# Patient Record
Sex: Male | Born: 1935 | Race: Black or African American | Hispanic: No | Marital: Married | Smoking: Never smoker
Health system: Southern US, Community
[De-identification: ages and names within clinical notes are randomized; demographics above are authoritative.]

## PROBLEM LIST (undated history)

## (undated) DIAGNOSIS — M1990 Unspecified osteoarthritis, unspecified site: Secondary | ICD-10-CM

## (undated) DIAGNOSIS — G4730 Sleep apnea, unspecified: Secondary | ICD-10-CM

## (undated) DIAGNOSIS — K5790 Diverticulosis of intestine, part unspecified, without perforation or abscess without bleeding: Secondary | ICD-10-CM

## (undated) DIAGNOSIS — J309 Allergic rhinitis, unspecified: Secondary | ICD-10-CM

## (undated) DIAGNOSIS — I10 Essential (primary) hypertension: Secondary | ICD-10-CM

## (undated) DIAGNOSIS — Z8619 Personal history of other infectious and parasitic diseases: Secondary | ICD-10-CM

## (undated) DIAGNOSIS — F0390 Unspecified dementia without behavioral disturbance: Secondary | ICD-10-CM

## (undated) DIAGNOSIS — E119 Type 2 diabetes mellitus without complications: Secondary | ICD-10-CM

## (undated) DIAGNOSIS — H409 Unspecified glaucoma: Secondary | ICD-10-CM

## (undated) HISTORY — DX: Type 2 diabetes mellitus without complications: E11.9

## (undated) HISTORY — DX: Diverticulosis of intestine, part unspecified, without perforation or abscess without bleeding: K57.90

## (undated) HISTORY — DX: Unspecified osteoarthritis, unspecified site: M19.90

## (undated) HISTORY — DX: Sleep apnea, unspecified: G47.30

## (undated) HISTORY — DX: Essential (primary) hypertension: I10

## (undated) HISTORY — DX: Unspecified glaucoma: H40.9

## (undated) HISTORY — DX: Allergic rhinitis, unspecified: J30.9

## (undated) HISTORY — DX: Personal history of other infectious and parasitic diseases: Z86.19

---

## 1997-08-24 ENCOUNTER — Ambulatory Visit (HOSPITAL_COMMUNITY): Admission: RE | Admit: 1997-08-24 | Discharge: 1997-08-24 | Payer: Self-pay | Admitting: *Deleted

## 1999-12-20 ENCOUNTER — Encounter: Payer: Self-pay | Admitting: Internal Medicine

## 1999-12-20 ENCOUNTER — Encounter: Admission: RE | Admit: 1999-12-20 | Discharge: 1999-12-20 | Payer: Self-pay | Admitting: Internal Medicine

## 2000-01-03 ENCOUNTER — Encounter: Payer: Self-pay | Admitting: Internal Medicine

## 2000-01-03 ENCOUNTER — Encounter: Admission: RE | Admit: 2000-01-03 | Discharge: 2000-01-03 | Payer: Self-pay | Admitting: Internal Medicine

## 2000-10-02 ENCOUNTER — Inpatient Hospital Stay (HOSPITAL_COMMUNITY): Admission: AD | Admit: 2000-10-02 | Discharge: 2000-10-06 | Payer: Self-pay | Admitting: Internal Medicine

## 2000-10-09 ENCOUNTER — Encounter: Admission: RE | Admit: 2000-10-09 | Discharge: 2001-01-07 | Payer: Self-pay | Admitting: Internal Medicine

## 2001-01-22 ENCOUNTER — Ambulatory Visit (HOSPITAL_COMMUNITY): Admission: AD | Admit: 2001-01-22 | Discharge: 2001-01-22 | Payer: Self-pay | Admitting: Interventional Cardiology

## 2001-10-30 ENCOUNTER — Encounter: Payer: Self-pay | Admitting: Internal Medicine

## 2001-10-30 ENCOUNTER — Encounter: Admission: RE | Admit: 2001-10-30 | Discharge: 2001-10-30 | Payer: Self-pay | Admitting: Internal Medicine

## 2001-12-10 ENCOUNTER — Ambulatory Visit (HOSPITAL_COMMUNITY): Admission: RE | Admit: 2001-12-10 | Discharge: 2001-12-10 | Payer: Self-pay | Admitting: Internal Medicine

## 2002-06-16 ENCOUNTER — Encounter: Payer: Self-pay | Admitting: Gastroenterology

## 2005-04-24 ENCOUNTER — Encounter: Admission: RE | Admit: 2005-04-24 | Discharge: 2005-04-24 | Payer: Self-pay | Admitting: Internal Medicine

## 2005-05-16 ENCOUNTER — Ambulatory Visit (HOSPITAL_BASED_OUTPATIENT_CLINIC_OR_DEPARTMENT_OTHER): Admission: RE | Admit: 2005-05-16 | Discharge: 2005-05-16 | Payer: Self-pay | Admitting: Internal Medicine

## 2005-05-21 ENCOUNTER — Ambulatory Visit: Payer: Self-pay | Admitting: Internal Medicine

## 2005-06-14 ENCOUNTER — Ambulatory Visit: Payer: Self-pay | Admitting: Pulmonary Disease

## 2005-08-10 ENCOUNTER — Ambulatory Visit: Payer: Self-pay | Admitting: Pulmonary Disease

## 2006-10-12 ENCOUNTER — Encounter: Admission: RE | Admit: 2006-10-12 | Discharge: 2006-10-12 | Payer: Self-pay | Admitting: Internal Medicine

## 2006-11-19 ENCOUNTER — Ambulatory Visit: Payer: Self-pay | Admitting: Orthopedic Surgery

## 2007-02-08 ENCOUNTER — Encounter: Payer: Self-pay | Admitting: Gastroenterology

## 2007-06-20 ENCOUNTER — Encounter: Payer: Self-pay | Admitting: Gastroenterology

## 2007-09-20 ENCOUNTER — Encounter: Payer: Self-pay | Admitting: Gastroenterology

## 2008-02-25 ENCOUNTER — Encounter: Payer: Self-pay | Admitting: Gastroenterology

## 2008-06-03 ENCOUNTER — Encounter: Payer: Self-pay | Admitting: Gastroenterology

## 2008-08-18 ENCOUNTER — Encounter: Payer: Self-pay | Admitting: Gastroenterology

## 2008-09-09 ENCOUNTER — Encounter: Admission: RE | Admit: 2008-09-09 | Discharge: 2008-09-09 | Payer: Self-pay | Admitting: Sports Medicine

## 2008-09-25 ENCOUNTER — Encounter: Admission: RE | Admit: 2008-09-25 | Discharge: 2008-09-25 | Payer: Self-pay | Admitting: Sports Medicine

## 2008-10-09 ENCOUNTER — Encounter: Payer: Self-pay | Admitting: Gastroenterology

## 2008-11-12 DIAGNOSIS — H409 Unspecified glaucoma: Secondary | ICD-10-CM

## 2008-11-12 DIAGNOSIS — I10 Essential (primary) hypertension: Secondary | ICD-10-CM

## 2008-11-12 DIAGNOSIS — M545 Low back pain: Secondary | ICD-10-CM | POA: Insufficient documentation

## 2008-11-12 DIAGNOSIS — E119 Type 2 diabetes mellitus without complications: Secondary | ICD-10-CM | POA: Insufficient documentation

## 2008-11-12 DIAGNOSIS — M1990 Unspecified osteoarthritis, unspecified site: Secondary | ICD-10-CM

## 2008-11-12 DIAGNOSIS — A048 Other specified bacterial intestinal infections: Secondary | ICD-10-CM | POA: Insufficient documentation

## 2008-11-12 DIAGNOSIS — J309 Allergic rhinitis, unspecified: Secondary | ICD-10-CM | POA: Insufficient documentation

## 2008-11-16 ENCOUNTER — Ambulatory Visit: Payer: Self-pay | Admitting: Gastroenterology

## 2008-11-16 DIAGNOSIS — G4730 Sleep apnea, unspecified: Secondary | ICD-10-CM | POA: Insufficient documentation

## 2008-11-16 DIAGNOSIS — K5730 Diverticulosis of large intestine without perforation or abscess without bleeding: Secondary | ICD-10-CM | POA: Insufficient documentation

## 2008-11-30 ENCOUNTER — Ambulatory Visit: Payer: Self-pay | Admitting: Gastroenterology

## 2008-12-02 ENCOUNTER — Encounter: Payer: Self-pay | Admitting: Gastroenterology

## 2008-12-03 ENCOUNTER — Telehealth: Payer: Self-pay | Admitting: Gastroenterology

## 2009-01-21 ENCOUNTER — Ambulatory Visit: Payer: Self-pay | Admitting: Gastroenterology

## 2009-01-21 DIAGNOSIS — R198 Other specified symptoms and signs involving the digestive system and abdomen: Secondary | ICD-10-CM | POA: Insufficient documentation

## 2009-01-22 ENCOUNTER — Ambulatory Visit: Payer: Self-pay | Admitting: Gastroenterology

## 2009-01-25 ENCOUNTER — Encounter: Payer: Self-pay | Admitting: Gastroenterology

## 2009-02-16 ENCOUNTER — Ambulatory Visit: Payer: Self-pay | Admitting: Gastroenterology

## 2009-11-18 ENCOUNTER — Ambulatory Visit (HOSPITAL_BASED_OUTPATIENT_CLINIC_OR_DEPARTMENT_OTHER): Admission: RE | Admit: 2009-11-18 | Discharge: 2009-11-18 | Payer: Self-pay | Admitting: Orthopedic Surgery

## 2010-02-07 ENCOUNTER — Ambulatory Visit: Payer: Self-pay | Admitting: Orthopedic Surgery

## 2010-02-07 DIAGNOSIS — M7660 Achilles tendinitis, unspecified leg: Secondary | ICD-10-CM | POA: Insufficient documentation

## 2010-08-16 NOTE — Letter (Signed)
Summary: History form  History form   Imported By: Jacklynn Ganong 02/10/2010 11:44:56  _____________________________________________________________________  External Attachment:    Type:   Image     Comment:   External Document

## 2010-08-16 NOTE — Progress Notes (Signed)
Summary: Initial evaluation  Initial evaluation   Imported By: Jacklynn Ganong 01/25/2010 07:53:40  _____________________________________________________________________  External Attachment:    Type:   Image     Comment:   External Document

## 2010-08-16 NOTE — Assessment & Plan Note (Signed)
Summary: RT FOOT/HEEL PAIN/NEEDS XRAY/SEC HORIZ/CAF   Vital Signs:  Patient profile:   77 year old male Height:      72 inches Weight:      199 pounds Pulse rate:   68 / minute Resp:     16 per minute  Vitals Entered By: Fuller Canada MD (February 07, 2010 2:17 PM)  Visit Type:  new patient Referring Provider:  Willey Blade, MD Primary Provider:  Willey Blade, MD  CC:  right foot pain.  History of Present Illness: I saw Jerome Chambers in the office today for an initial visit.  He is a 77 years old man with the complaint of:  right foot pain, heel area.  No xrays have been taken.  Meds: Vitamins, ASA, Fexofenadine, HCTZ, Lisinopril, Metformin.  This is a 77 yo male with h/o recurrent bouts of pain and swelling inthe area of the posterior right heel. He has treated himself successfully with vinegar soaks, heel cushions, and pressure relief. He is no longer hurting. The back of the heel has a s8ignificant " pump" bump.     Allergies (verified): No Known Drug Allergies  Family History: No FH of Colon Cancer: Family History of Diabetes  Social History: Married Retired Patient is a former smoker.  Alcohol Use - no Daily Caffeine Use Illicit Drug Use - no 3 yrs of college  Review of Systems Constitutional:  Denies weight loss, weight gain, fever, chills, and fatigue. Cardiovascular:  Denies chest pain, palpitations, fainting, and murmurs. Respiratory:  Complains of snoring; denies short of breath, wheezing, couch, tightness, pain on inspiration, and snoring . Gastrointestinal:  Denies heartburn, nausea, vomiting, diarrhea, constipation, and blood in your stools. Genitourinary:  Denies frequency, urgency, difficulty urinating, painful urination, flank pain, and bleeding in urine. Neurologic:  Denies numbness, tingling, unsteady gait, dizziness, tremors, and seizure. Musculoskeletal:  Denies joint pain, swelling, instability, stiffness, redness, heat, and muscle pain. Endocrine:   Denies excessive thirst, exessive urination, and heat or cold intolerance. Psychiatric:  Denies nervousness, depression, anxiety, and hallucinations. Skin:  Denies changes in the skin, poor healing, rash, itching, and redness. HEENT:  Denies blurred or double vision, eye pain, redness, and watering. Immunology:  Denies seasonal allergies, sinus problems, and allergic to bee stings. Hemoatologic:  Denies easy bleeding and brusing.   Foot/Ankle Exam  General:    Well-developed, well-nourished ,normal body habitus; no deformities, normal grooming.    Gait:    Normal heel-toe gait pattern bilaterally.    Skin:    Intact with no scars, lesions, rashes, cafe-au-lait spots or bruising.    Inspection:    Inspection reveals no deformity, ecchymosis or swelling. There is a non tender pump bumb   Palpation:    non-tender to palpation over distal leg, medial and lateral ankle, distal achilles tendon, medial and lateral hindfoot, posterior heel, plantar heel, midfoot and forefoot bilaterally.   Vascular:    dorsalis pedis and posterior tibial pulses 2+ and symmetric, capillary refill < 2 seconds, normal hair pattern, no evidence of ischemia.   Sensory:    gross sensation intact bilaterally in lower extremities.    Motor:    Motor strength 5/5 bilaterally for ankle dorsiflexion, ankle plantar flexion, ankle inversion and ankle eversion.    Ankle Exam:    Right:    Inspection/Palpation:  the ankle and foot had normal ROM and normal PF strength with normal stabilty and alignment.  He did show some evidence of fungal infection in his toes  Impression & Recommendations:  Problem # 1:  ACHILLES BURSITIS OR TENDINITIS (ICD-726.71) Assessment New  Orders: New Patient Level II (60454)  Patient Instructions: 1)  Please schedule a follow-up appointment as needed.

## 2010-08-18 ENCOUNTER — Other Ambulatory Visit (INDEPENDENT_AMBULATORY_CARE_PROVIDER_SITE_OTHER): Payer: Self-pay | Admitting: Otolaryngology

## 2010-08-18 ENCOUNTER — Ambulatory Visit (INDEPENDENT_AMBULATORY_CARE_PROVIDER_SITE_OTHER): Payer: Self-pay | Admitting: Otolaryngology

## 2010-08-18 DIAGNOSIS — H905 Unspecified sensorineural hearing loss: Secondary | ICD-10-CM

## 2010-08-19 ENCOUNTER — Ambulatory Visit
Admission: RE | Admit: 2010-08-19 | Discharge: 2010-08-19 | Disposition: A | Discharge status: Home / Self Care | Payer: Medicare Other | Source: Ambulatory Visit | Attending: Otolaryngology | Admitting: Otolaryngology

## 2010-08-19 DIAGNOSIS — H905 Unspecified sensorineural hearing loss: Secondary | ICD-10-CM

## 2010-08-19 MED ORDER — GADOBENATE DIMEGLUMINE 529 MG/ML IV SOLN: 15.0000 mL | Freq: Once | INTRAVENOUS | Status: AC | PRN

## 2010-10-04 LAB — POCT I-STAT, CHEM 8
Calcium, Ion: 1.24 mmol/L (ref 1.12–1.32)
HCT: 34 % — ABNORMAL LOW (ref 39.0–52.0)
Hemoglobin: 11.6 g/dL — ABNORMAL LOW (ref 13.0–17.0)
Sodium: 137 mEq/L (ref 135–145)
TCO2: 26 mmol/L (ref 0–100)

## 2010-10-04 LAB — GLUCOSE, CAPILLARY: Glucose-Capillary: 102 mg/dL — ABNORMAL HIGH (ref 70–99)

## 2010-11-02 ENCOUNTER — Encounter: Payer: Self-pay | Admitting: Internal Medicine

## 2010-12-02 NOTE — Cardiovascular Report (Signed)
[place]. [facility]  Patient:    Jerome Chambers, Jerome Chambers                           MRN: [id number] Proc. Date: 01/22/01 Adm. Date:  [id number] Attending:  Lyn Records. Iii CC:         Eric L. August Saucer, M.D.   Cardiac Catheterization  INDICATIONS:  Lifelong history of exertional dyspnea.  Frequent PVCs. Abnormal Cardiolite study demonstrating perfusion abnormality on both the anterior and inferior walls and reduced ejection fraction.  PROCEDURE: 1. Left heart catheterization. 2. Selective coronary angiography. 3. Left ventriculography.  DESCRIPTION OF PROCEDURE:  After informed consent, a 6 French sheath was started in the right femoral artery using the modified Seldinger technique.  A 6 French A2 multipurpose catheter was used for hemodynamic recordings, left ventriculography, and selective left coronary angiography.  A 6 French #4 right Judkins catheter was used for right coronary angiography.  The patient tolerated the procedure without complications.  RESULTS:  HEMODYNAMIC DATA:  Aortic pressure 113/67, left ventricular pressure 113/12.  Left ventriculography.  The left ventricle is normal in size and demonstrates low normal LV contractility.  Estimated ejection fraction is 55%.  No mitral regurgitation is noted.  End diastolic pressure is normal.  SELECTIVE CORONARY ANGIOGRAPHY:  Left main coronary artery.  Normal.  Left anterior descending coronary artery.  LAD is large and wraps around the left ventricular apex.  There is a kink in the LAD in the distal portion of the midvessel.  No significant fixed obstruction is noted in the LAD or diagonals.  Minimal luminal irregularities are identified.  Circumflex artery.  The circumflex is large, giving origin to three obtuse marginal branches.  No obstruction is identified.  Right coronary artery.  The right coronary artery is large and free of any significant obstruction.  There is no catheter damping on  engagement of the right coronary.  There is felt to be mild catheter-tip-induced ostial spasm. The right coronary artery gives a branching PDA and two small left ventricular branches and is free of obstruction.  CONCLUSION: 1. Kinking of the mid/distal left anterior descending, no significant fixed    obstruction is noted. 2. Normal left ventricular function.  RECOMMENDATION:  No further cardiac evaluation.  Continue therapy of diabetes. Hold Glucophage x 48 hours before resuming. DD:  01/22/01 TD:  01/22/01 Job: [id number]

## 2010-12-02 NOTE — Discharge Summary (Signed)
[place]. [facility]  Patient:    MALIKI, GIGNAC                           MRN: [id number] Adm. Date:  [id number] Disc. Date: [id number] Attending:  Gwenyth Bender                           Discharge Summary  FINAL DIAGNOSES: 1. Diabetes mellitus, type 2; 250.92. 2. Hypovolemia; 276.5. 3. Allergic rhinitis; 477.9.  OPERATIONS/PROCEDURES:  None.  HISTORY OF PRESENT ILLNESS:  This was the first recent [place] admission for this 77 year old married black male previously doing well until approximately two weeks prior to admission.  At that time he developed a sinus infection which was treated with Levaquin and Allegra.  Over the subsequent week, however, he noted progressive weakness.  This was followed by increasing polyuria and polydipsia as well.  The patient subsequently came to the office for further evaluation.  A random blood sugar was obtained at that time with a value of 315.  He notably at the time of presentation, had lost four pounds in one weeks time as well.  He had lost approximately 10 pounds over the past month and a half.  The patient was noted to be mildly volume depleted as well.  He was subsequently admitted for treatment of new onset diabetes mellitus and dehydration.  PAST MEDICAL HISTORY:  As per admission H&P.  PHYSICAL EXAMINATION:  As per admission H&P.  HOSPITAL COURSE:  The patient was admitted for further evaluation and treatment of diabetes mellitus.  He was noted to be mildly volume depleted. He was started on IV with half normal saline and 20 of KCl per liter.  He was placed on a sliding scale insulin regimen as well.  The patient was instructed in diabetic diet.  He was also instructed on the use of the glucometer.  Over the subsequent days with IV fluids as well as the sliding scale insulin regimen, his blood sugars gradually decreased.  He was noted to have a blood sugar of 313 at the time of admission  with a hemoglobin A1C of 12.7.  This did gradually improve. The patient was treated with glucophage with subsequent addition of Actos.  By October 05, 2000, he was doing very well.  There was an effort to avoid insulin therapy. The patient, however, was felt to still need some intermittent insulin on a sliding scale.  He was instructed in the use of this at the time of discharge.  By October 06, 2000, he was felt to be stable for discharge and feeling much better.  He was ambulatory with energy returning to normal.  The patient, while hospitalized, was noted to have a borderline EKG.  A 2D echo was obtained to evaluate his LV function.  A left ventricular ejection fraction was 50 to 55%. There was mild hypokinesis at the mid inferior septal wall. There was also mild mitral valve regurgitation.  This will be followed up on as an outpatient.  MEDICATIONS AT DISCHARGE: 1. Glucophage XR 500 mg two tablets twice a day. 2. Actos 30 mg p.o. q.a.m. 3. Allegra 180 mg p.o. q.d. 4. Aspirin 81 mg q.d. 5. Humalog insulin as needed on a sliding scale.  Sliding scale being for    CBGs greater than or equal to 350: 12 units; 280 to 349: 9 units; 210  to    279: 6 units; 140 to 209: 3 units.  DIET:  The patient will be maintained on a 2000 calorie ADA diet.  FOLLOW-UP:  He will be followed up in our office in two weeks time. DD:  11/07/00 TD:  11/09/00 Job: [id number]

## 2010-12-02 NOTE — H&P (Signed)
[place]. [facility]  Patient:    Jerome Chambers, Jerome Chambers                          MRN: [id number] Adm. Date:  10/02/00 Attending:  Minerva Areola L. August Saucer, M.D.                         History and Physical  CHIEF COMPLAINT:  Progressive weakness and onset of diabetes mellitus.  HISTORY OF PRESENT ILLNESS:  First recent [facility] admission for this 77 year old married black male who had been previously doing very well up until approximately two weeks ago. At that time, he developed a sinus infection, at which time he was treated with Levaquin and Allegra. Over the subsequent week, however, he noted progressive weakness. He had increased polyuria, polydipsia as well. This did not improve. The patient subsequently came to the office for further evaluation. A fasting blood sugar was obtained showing a value of 315. He had notably had lost 4 pounds in one weeks time. On further documentation it was noted that he had lost approximately 10 pounds over the past month and a half. The patient was noted to be dry, as well. He is admitted for further evaluation and therapy.  PAST MEDICAL HISTORY: 1. Remarkable for longstanding allergic rhinitis with intermittent bouts of    sinusitis in the past. 2. Mild BPH in the past. 3. No previous documented history of diabetes mellitus. 4. Notable for kidney stone approximately six years ago, which was passed    without surgery. 5. No other major illnesses or surgeries.  FAMILY HISTORY:  Remarkable for a brother who has hypertension and diabetes mellitus. No documented history of coronary artery disease.  HABITS:  The patient does not smoke or drink.  SOCIAL HISTORY:  Presently retired.  PRESENT MEDICATIONS: 1. Allegra at 180 mg p.o. q.d. 2. Recently on Levaquin, as noted.  REVIEW OF SYSTEMS:  As noted above. He has had blurred vision over the past week, as well. He had been eating more ice cream than usual. No other  sweet intake. He tends to avoid fried foods.  ALLERGIES:  No known allergies.  PHYSICAL EXAMINATION:  GENERAL:  A well-developed, well-nourished black male currently in no acute distress.  VITAL SIGNS:  Weight 201.9 pounds. Usual weight 214 pounds. Blood pressure 140/84, pulse 84, respiratory rate 20, temperature 97.8. Height 5 feet 11 inches.  HEENT:  Head normocephalic atraumatic, without bruits. Extraocular muscles intact. Fundi grade I. There is no sinus tenderness. His left turbinate being greater than right. TMs with decreased light reflex bilaterally without erythematous changes. Throat with good dentition. Posterior pharynx is clear. Membranes are dry.  NECK:  Supple. No enlarged thyroid. No posterior cervical nodes.  LUNGS:  Clear without wheezes, rales or rhonchi.  CARDIOVASCULAR:  Normal S1 and S2. No S3. Occasional ectopic beat noted. No rub appreciated.  ABDOMEN:  Bowel sounds are present. No enlarged liver or spleen, masses or tenderness.  GENITOURINARY:  Normal circumcised male with bilaterally descended testes. No masses appreciated.  MUSCULOSKELETAL:  Full passive range of motion without cyanosis or clubbing. Minimal crepitus in the knees. Negative Homans. No edema. Skin with decreased turgor. Pulses intact.  LABORATORY DATA:  Pending, at this time.  IMPRESSION: 1. Diabetes mellitus, new onset. 2. Dehydration. 3. Allergic rhinitis.  PLAN:  The patient will be placed on IV fluids of normal saline with potassium. Then, proceeding  on to half normal saline. This will be pending his electrolyte evaluation. He will be placed on a sliding scale with Humulin R initially. Will begin diabetic education on diet, as well as the use of a glucometer and understanding diabetes. A hemoglobin A1C has been ordered, as well. Further therapy pending response to the above regimen.DD:  10/02/00 TD:  10/03/00 Job: [id number]

## 2010-12-02 NOTE — Procedures (Signed)
Jerome Chambers, Jerome Chambers                    ACCOUNT NO.:  [id number]   MEDICAL RECORD NO.:  [id number]          PATIENT TYPE:  OUT   LOCATION:  SLEEP CENTER                 FACILITY:  [facility]   PHYSICIAN:  Clinton D. Maple Hudson, M.D. DATE OF BIRTH:  [birth year]   DATE OF STUDY:  05/16/2005                              NOCTURNAL POLYSOMNOGRAM   REFERRING PHYSICIAN:  Dr. Willey Blade.   DATE OF STUDY:  May 16, 2005.   INDICATION FOR STUDY:  Hypersomnia with sleep apnea.   EPWORTH SLEEPINESS SCORE:  17/24.   BMI:  28.   WEIGHT:  204 pounds.   The patient had a diagnostic sleep study five years earlier. That report is  not available for this study comparison but a C-PAP titration study was  requested.   SLEEP ARCHITECTURE:  Total sleep time 308 minutes with sleep efficiency 78%.  Stage I 5%, stage II 76%, stages III and IV absent, REM 18% of total sleep  time. Sleep latency 5 minutes, REM latency 78 minutes, awake after sleep  onset 42 minutes, arousal index 16. No bedtime medication was reported.   RESPIRATORY DATA:  Split study protocol. Apnea/hypopnea index (AHI, RDI)  36.1 obstructive events per hour indicating moderately severe obstructive  sleep apnea/hypopnea syndrome before C-PAP. This included 23 obstructive  apneas and 64 hypopneas before C-PAP. Most sleep and therefore most events  were reported while supine. REM AHI 20 per hour. C-PAP was successfully  titrated to 8 CWP, AHI 0 per hour. A large Respironics comfort light #2  nasal mask was used with heated humidifier.   OXYGEN DATA:  Moderate to loud snoring and some mouth breathing with oxygen  desaturation to a nadir of 74% before C-PAP control. After C-PAP control,  saturation held 95-98% on room air.   CARDIAC DATA:  Sinus rhythm with occasional PVC.   MOVEMENT/PARASOMNIA:  Frequent limb jerks but few of these were associated  with arousal or awakening.   IMPRESSION/RECOMMENDATIONS:  1.  Moderately severe obstructive sleep  apnea/hypopnea syndrome, AHI 36.1      per hour with moderate to loud snoring and oxygen desaturation to 74%.  2.  Successful C-PAP titration to 8 CWP, AHI 0 per hour. A large Respironics      comfort light #2 nasal mask was      used with heated humidifier.  3.  Frequent leg jerks did not seem associated with sleep disturbance on the      study night.      Clinton D. Maple Hudson, M.D.  Diplomate, Biomedical engineer of Sleep Medicine  Electronically Signed     CDY/MEDQ  D:  05/21/2005 15:32:03  T:  05/22/2005 01:10:55  Job:  [id number]

## 2012-05-01 ENCOUNTER — Encounter: Payer: Self-pay | Admitting: Gastroenterology

## 2012-05-14 ENCOUNTER — Encounter: Payer: Self-pay | Admitting: Gastroenterology

## 2012-07-02 ENCOUNTER — Encounter: Payer: Self-pay | Admitting: Gastroenterology

## 2012-07-03 ENCOUNTER — Encounter: Payer: Self-pay | Admitting: Gastroenterology

## 2012-07-03 ENCOUNTER — Ambulatory Visit (INDEPENDENT_AMBULATORY_CARE_PROVIDER_SITE_OTHER): Payer: Medicare Other | Admitting: Gastroenterology

## 2012-07-03 VITALS — BP 128/64 | HR 74 | Ht 71.0 in | Wt 192.0 lb

## 2012-07-03 DIAGNOSIS — Z1211 Encounter for screening for malignant neoplasm of colon: Secondary | ICD-10-CM | POA: Insufficient documentation

## 2012-07-03 DIAGNOSIS — K5730 Diverticulosis of large intestine without perforation or abscess without bleeding: Secondary | ICD-10-CM

## 2012-07-03 MED ORDER — NA SULFATE-K SULFATE-MG SULF 17.5-3.13-1.6 GM/177ML PO SOLN: 1.0000 | Freq: Once | ORAL | Status: DC

## 2012-07-03 NOTE — Assessment & Plan Note (Signed)
Asymptomatic. 

## 2012-07-03 NOTE — Assessment & Plan Note (Signed)
Although the patient is  24 he appears far younger than his stated age and has requested screening examination. Accordingly, he will be scheduled for colonoscopy.

## 2012-07-03 NOTE — Patient Instructions (Addendum)
You have been scheduled for a colonoscopy with propofol. Please follow written instructions given to you at your visit today.  Please pick up your prep kit at the pharmacy within the next 1-3 days. If you use inhalers (even only as needed) or a CPAP machine, please bring them with you on the day of your procedure.  

## 2012-07-03 NOTE — Progress Notes (Signed)
History of Present Illness: Pleasant 78 year old Afro-American male here to set up screening colonoscopy. Last colonoscopy 10 years ago demonstrated diverticulosis. He has no GI complaints including change of bowel habits, abdominal pain, melena or hematochezia.   Past Medical History  Diagnosis Date  . History of Helicobacter pylori infection   . Diverticulosis   . Diabetes   . Glaucoma   . Unspecified essential hypertension   . DJD (degenerative joint disease)   . Sleep apnea     NO CPAP MACHINE   . Allergic rhinitis, cause unspecified    No past surgical history on file. family history is negative for Colon cancer. Current Outpatient Prescriptions  Medication Sig Dispense Refill  . aspirin 81 MG tablet Take 81 mg by mouth daily.      . hydrochlorothiazide (HYDRODIURIL) 25 MG tablet Take 25 mg by mouth daily.      Marland Kitchen lisinopril (PRINIVIL,ZESTRIL) 10 MG tablet Take 10 mg by mouth daily.      Marland Kitchen loratadine (CLARITIN) 10 MG tablet Take 10 mg by mouth daily.      . metFORMIN (GLUCOPHAGE) 1000 MG tablet Take 1,000 mg by mouth 2 (two) times daily with a meal.       Allergies as of 07/03/2012  . (No Known Allergies)    reports that he has never smoked. He has never used smokeless tobacco. He reports that he does not drink alcohol or use illicit drugs.     Review of Systems: Pertinent positive and negative review of systems were noted in the above HPI section. All other review of systems were otherwise negative.  Vital signs were reviewed in today's medical record Physical Exam: General: Well developed , well nourished appearing far younger than his stated age Head: Normocephalic and atraumatic Eyes:  sclerae anicteric, EOMI Ears: Normal auditory acuity Mouth: No deformity or lesions Neck: Supple, no masses or thyromegaly Lungs: Clear throughout to auscultation Heart: Regular rate and rhythm; no murmurs, rubs or bruits Abdomen: Soft, non tender and non distended. No masses,  hepatosplenomegaly or hernias noted. Normal Bowel sounds Rectal:deferred Musculoskeletal: Symmetrical with no gross deformities  Skin: No lesions on visible extremities Pulses:  Normal pulses noted Extremities: No clubbing, cyanosis, edema or deformities noted Neurological: Alert oriented x 4, grossly nonfocal Cervical Nodes:  No significant cervical adenopathy Inguinal Nodes: No significant inguinal adenopathy Psychological:  Alert and cooperative. Normal mood and affect

## 2012-08-01 ENCOUNTER — Ambulatory Visit
Admission: RE | Admit: 2012-08-01 | Discharge: 2012-08-01 | Disposition: A | Discharge status: Home / Self Care | Payer: Medicare Other | Source: Ambulatory Visit | Attending: Internal Medicine | Admitting: Internal Medicine

## 2012-08-01 ENCOUNTER — Other Ambulatory Visit: Payer: Self-pay | Admitting: Internal Medicine

## 2012-08-01 DIAGNOSIS — M25569 Pain in unspecified knee: Secondary | ICD-10-CM

## 2012-08-14 ENCOUNTER — Encounter: Payer: Medicare Other | Admitting: Gastroenterology

## 2012-08-20 ENCOUNTER — Ambulatory Visit: Payer: Self-pay | Admitting: Orthopedic Surgery

## 2012-10-01 ENCOUNTER — Other Ambulatory Visit: Payer: Self-pay | Admitting: Gastroenterology

## 2012-10-01 ENCOUNTER — Encounter: Payer: Medicare Other | Admitting: Gastroenterology

## 2012-10-01 ENCOUNTER — Ambulatory Visit (AMBULATORY_SURGERY_CENTER): Payer: Medicare Other | Admitting: Gastroenterology

## 2012-10-01 ENCOUNTER — Encounter: Payer: Self-pay | Admitting: Gastroenterology

## 2012-10-01 VITALS — BP 105/68 | HR 66 | Temp 97.0°F | Resp 27 | Ht 71.0 in | Wt 192.0 lb

## 2012-10-01 DIAGNOSIS — K5730 Diverticulosis of large intestine without perforation or abscess without bleeding: Secondary | ICD-10-CM

## 2012-10-01 DIAGNOSIS — Z1211 Encounter for screening for malignant neoplasm of colon: Secondary | ICD-10-CM

## 2012-10-01 DIAGNOSIS — K648 Other hemorrhoids: Secondary | ICD-10-CM

## 2012-10-01 LAB — GLUCOSE, CAPILLARY: Glucose-Capillary: 114 mg/dL — ABNORMAL HIGH (ref 70–99)

## 2012-10-01 MED ORDER — SODIUM CHLORIDE 0.9 % IV SOLN: 500.0000 mL | INTRAVENOUS | Status: DC

## 2012-10-01 NOTE — Progress Notes (Signed)
Patient did not experience any of the following events: a burn prior to discharge; a fall within the facility; wrong site/side/patient/procedure/implant event; or a hospital transfer or hospital admission upon discharge from the facility. (G8907) Patient did not have preoperative order for IV antibiotic SSI prophylaxis. (G8918)  

## 2012-10-01 NOTE — Patient Instructions (Signed)
YOU HAD AN ENDOSCOPIC PROCEDURE TODAY AT THE [facility]: Refer to the procedure report that was given to you for any specific questions about what was found during the examination.  If the procedure report does not answer your questions, please call your gastroenterologist to clarify.  If you requested that your care partner not be given the details of your procedure findings, then the procedure report has been included in a sealed envelope for you to review at your convenience later.  YOU SHOULD EXPECT: Some feelings of bloating in the abdomen. Passage of more gas than usual.  Walking can help get rid of the air that was put into your GI tract during the procedure and reduce the bloating. If you had a lower endoscopy (such as a colonoscopy or flexible sigmoidoscopy) you may notice spotting of blood in your stool or on the toilet paper. If you underwent a bowel prep for your procedure, then you may not have a normal bowel movement for a few days.  DIET: Your first meal following the procedure should be a light meal and then it is ok to progress to your normal diet.  A half-sandwich or bowl of soup is an example of a good first meal.  Heavy or fried foods are harder to digest and may make you feel nauseous or bloated.  Likewise meals heavy in dairy and vegetables can cause extra gas to form and this can also increase the bloating.  Drink plenty of fluids but you should avoid alcoholic beverages for 24 hours.  ACTIVITY: Your care partner should take you home directly after the procedure.  You should plan to take it easy, moving slowly for the rest of the day.  You can resume normal activity the day after the procedure however you should NOT DRIVE or use heavy machinery for 24 hours (because of the sedation medicines used during the test).    SYMPTOMS TO REPORT IMMEDIATELY: A gastroenterologist can be reached at any hour.  During normal business hours, 8:30 AM to 5:00 PM Monday through Friday,  call [phone].  After hours and on weekends, please call the GI answering service at [phone] who will take a message and have the physician on call contact you.   Following lower endoscopy (colonoscopy or flexible sigmoidoscopy):  Excessive amounts of blood in the stool  Significant tenderness or worsening of abdominal pains  Swelling of the abdomen that is new, acute  Fever of 100F or higher    FOLLOW UP: If any biopsies were taken you will be contacted by phone or by letter within the next 1-3 weeks.  Call your gastroenterologist if you have not heard about the biopsies in 3 weeks.  Our staff will call the home number listed on your records the next business day following your procedure to check on you and address any questions or concerns that you may have at that time regarding the information given to you following your procedure. This is a courtesy call and so if there is no answer at the home number and we have not heard from you through the emergency physician on call, we will assume that you have returned to your regular daily activities without incident.  SIGNATURES/CONFIDENTIALITY: You and/or your care partner have signed paperwork which will be entered into your electronic medical record.  These signatures attest to the fact that that the information above on your After Visit Summary has been reviewed and is understood.  Full responsibility of the confidentiality   of this discharge information lies with you and/or your care-partner.   Information on diverticulosis,high fiber diet ,and hemorrhoids given to you today

## 2012-10-01 NOTE — Op Note (Signed)
[facility] [address] [place], [address]   COLONOSCOPY PROCEDURE REPORT  PATIENT: Jerome Chambers, Jerome Chambers  MR#: [id number] BIRTHDATE: [birth year] , 83  yrs. old GENDER: Male ENDOSCOPIST: Louis Meckel, MD REFERRED BY: PROCEDURE DATE:  10/01/2012 PROCEDURE:   Colonoscopy, screening ASA CLASS:   Class II INDICATIONS:Average risk patient for colon cancer. MEDICATIONS: MAC sedation, administered by CRNA and propofol (Diprivan) 300mg  IV  DESCRIPTION OF PROCEDURE:   After the risks benefits and alternatives of the procedure were thoroughly explained, informed consent was obtained.  A digital rectal exam revealed no abnormalities of the rectum.   The LB CF-[id number]  endoscope was introduced through the anus and advanced to the cecum, which was identified by both the appendix and ileocecal valve. No adverse events experienced.   The quality of the prep was Suprep excellent The instrument was then slowly withdrawn as the colon was fully examined.      COLON FINDINGS: Moderate diverticulosis was noted in the ascending colon.   Moderate diverticulosis was noted in the sigmoid colon and descending colon.   Internal hemorrhoids were found.   The colon mucosa was otherwise normal.  Retroflexed views revealed no abnormalities. The time to cecum=2 minutes 11 seconds.  Withdrawal time=9 minutes 05 seconds.  The scope was withdrawn and the procedure completed. COMPLICATIONS: There were no complications.  ENDOSCOPIC IMPRESSION: 1.   Moderate diverticulosis was noted in the ascending colon 2.   Moderate diverticulosis was noted in the sigmoid colon and descending colon 3.   Internal hemorrhoids 4.   The colon mucosa was otherwise normal  RECOMMENDATIONS: Given your age, you will not need another colonoscopy for colon cancer screening or polyp surveillance.  These types of tests usually stop around the age 55.   eSigned:  Louis Meckel, MD 10/01/2012 10:10  AM   cc:

## 2012-10-02 ENCOUNTER — Telehealth: Payer: Self-pay | Admitting: *Deleted

## 2012-10-02 NOTE — Telephone Encounter (Signed)
  Follow up Call-  Call back number 10/01/2012  Post procedure Call Back phone  # [phone]  Permission to leave phone message Yes     Patient questions:  Do you have a fever, pain , or abdominal swelling? no Pain Score  0 *  Have you tolerated food without any problems? yes  Have you been able to return to your normal activities? yes  Do you have any questions about your discharge instructions: Diet   no Medications  no Follow up visit  no  Do you have questions or concerns about your Care? no  Actions: * If pain score is 4 or above: No action needed, pain <4.

## 2012-11-21 ENCOUNTER — Encounter: Payer: Self-pay | Admitting: Internal Medicine

## 2015-01-25 ENCOUNTER — Encounter: Payer: Self-pay | Admitting: Gastroenterology

## 2015-07-02 ENCOUNTER — Ambulatory Visit
Admission: RE | Admit: 2015-07-02 | Discharge: 2015-07-02 | Disposition: A | Discharge status: Home / Self Care | Payer: Medicare Other | Source: Ambulatory Visit | Attending: Internal Medicine | Admitting: Internal Medicine

## 2015-07-02 ENCOUNTER — Other Ambulatory Visit: Payer: Self-pay | Admitting: Internal Medicine

## 2015-07-02 DIAGNOSIS — W19XXXA Unspecified fall, initial encounter: Secondary | ICD-10-CM

## 2017-08-16 ENCOUNTER — Observation Stay (HOSPITAL_COMMUNITY)
Admission: EM | Admit: 2017-08-16 | Discharge: 2017-08-17 | Disposition: A | Discharge status: Home / Self Care | Payer: Medicare Other | Attending: Internal Medicine | Admitting: Internal Medicine

## 2017-08-16 ENCOUNTER — Observation Stay (HOSPITAL_COMMUNITY): Payer: Medicare Other

## 2017-08-16 ENCOUNTER — Other Ambulatory Visit: Payer: Self-pay

## 2017-08-16 ENCOUNTER — Encounter (HOSPITAL_COMMUNITY): Payer: Self-pay

## 2017-08-16 DIAGNOSIS — N183 Chronic kidney disease, stage 3 unspecified: Secondary | ICD-10-CM | POA: Diagnosis present

## 2017-08-16 DIAGNOSIS — I10 Essential (primary) hypertension: Secondary | ICD-10-CM

## 2017-08-16 DIAGNOSIS — Z7982 Long term (current) use of aspirin: Secondary | ICD-10-CM | POA: Insufficient documentation

## 2017-08-16 DIAGNOSIS — N289 Disorder of kidney and ureter, unspecified: Secondary | ICD-10-CM | POA: Insufficient documentation

## 2017-08-16 DIAGNOSIS — Z79899 Other long term (current) drug therapy: Secondary | ICD-10-CM | POA: Insufficient documentation

## 2017-08-16 DIAGNOSIS — N178 Other acute kidney failure: Secondary | ICD-10-CM | POA: Diagnosis not present

## 2017-08-16 DIAGNOSIS — F0390 Unspecified dementia without behavioral disturbance: Secondary | ICD-10-CM | POA: Diagnosis present

## 2017-08-16 DIAGNOSIS — R112 Nausea with vomiting, unspecified: Secondary | ICD-10-CM

## 2017-08-16 DIAGNOSIS — E119 Type 2 diabetes mellitus without complications: Secondary | ICD-10-CM

## 2017-08-16 DIAGNOSIS — Z7984 Long term (current) use of oral hypoglycemic drugs: Secondary | ICD-10-CM | POA: Diagnosis not present

## 2017-08-16 DIAGNOSIS — I129 Hypertensive chronic kidney disease with stage 1 through stage 4 chronic kidney disease, or unspecified chronic kidney disease: Secondary | ICD-10-CM | POA: Insufficient documentation

## 2017-08-16 DIAGNOSIS — R197 Diarrhea, unspecified: Secondary | ICD-10-CM | POA: Diagnosis not present

## 2017-08-16 DIAGNOSIS — E1122 Type 2 diabetes mellitus with diabetic chronic kidney disease: Secondary | ICD-10-CM | POA: Insufficient documentation

## 2017-08-16 DIAGNOSIS — H409 Unspecified glaucoma: Secondary | ICD-10-CM | POA: Diagnosis present

## 2017-08-16 DIAGNOSIS — N179 Acute kidney failure, unspecified: Secondary | ICD-10-CM | POA: Diagnosis not present

## 2017-08-16 DIAGNOSIS — K529 Noninfective gastroenteritis and colitis, unspecified: Secondary | ICD-10-CM | POA: Diagnosis not present

## 2017-08-16 DIAGNOSIS — R52 Pain, unspecified: Secondary | ICD-10-CM

## 2017-08-16 DIAGNOSIS — K5289 Other specified noninfective gastroenteritis and colitis: Principal | ICD-10-CM | POA: Insufficient documentation

## 2017-08-16 DIAGNOSIS — M1990 Unspecified osteoarthritis, unspecified site: Secondary | ICD-10-CM | POA: Diagnosis present

## 2017-08-16 LAB — COMPREHENSIVE METABOLIC PANEL
ALT: 16 U/L — AB (ref 17–63)
AST: 22 U/L (ref 15–41)
Albumin: 3.9 g/dL (ref 3.5–5.0)
Alkaline Phosphatase: 65 U/L (ref 38–126)
Anion gap: 15 (ref 5–15)
BILIRUBIN TOTAL: 1 mg/dL (ref 0.3–1.2)
BUN: 31 mg/dL — AB (ref 6–20)
CALCIUM: 9.6 mg/dL (ref 8.9–10.3)
CO2: 21 mmol/L — ABNORMAL LOW (ref 22–32)
CREATININE: 2.02 mg/dL — AB (ref 0.61–1.24)
Chloride: 102 mmol/L (ref 101–111)
GFR calc Af Amer: 33 mL/min — ABNORMAL LOW (ref >60)
GFR, EST NON AFRICAN AMERICAN: 28 mL/min — AB (ref >60)
Glucose, Bld: 218 mg/dL — ABNORMAL HIGH (ref 65–99)
Potassium: 4.6 mmol/L (ref 3.5–5.1)
Sodium: 138 mmol/L (ref 135–145)
TOTAL PROTEIN: 8.2 g/dL — AB (ref 6.5–8.1)

## 2017-08-16 LAB — URINALYSIS, ROUTINE W REFLEX MICROSCOPIC
BACTERIA UA: NONE SEEN
BILIRUBIN URINE: NEGATIVE
Glucose, UA: 50 mg/dL — AB
KETONES UR: 20 mg/dL — AB
LEUKOCYTES UA: NEGATIVE
Nitrite: NEGATIVE
PROTEIN: 100 mg/dL — AB
SPECIFIC GRAVITY, URINE: 1.015 (ref 1.005–1.030)
SQUAMOUS EPITHELIAL / LPF: NONE SEEN
pH: 5 (ref 5.0–8.0)

## 2017-08-16 LAB — CBC WITH DIFFERENTIAL/PLATELET
BASOS ABS: 0 10*3/uL (ref 0.0–0.1)
BASOS PCT: 0 %
EOS PCT: 0 %
Eosinophils Absolute: 0 10*3/uL (ref 0.0–0.7)
HCT: 32.4 % — ABNORMAL LOW (ref 39.0–52.0)
Hemoglobin: 10.8 g/dL — ABNORMAL LOW (ref 13.0–17.0)
Lymphocytes Relative: 8 %
Lymphs Abs: 0.5 10*3/uL — ABNORMAL LOW (ref 0.7–4.0)
MCH: 32.8 pg (ref 26.0–34.0)
MCHC: 33.3 g/dL (ref 30.0–36.0)
MCV: 98.5 fL (ref 78.0–100.0)
MONO ABS: 0.2 10*3/uL (ref 0.1–1.0)
Monocytes Relative: 3 %
Neutro Abs: 5.6 10*3/uL (ref 1.7–7.7)
Neutrophils Relative %: 89 %
PLATELETS: 233 10*3/uL (ref 150–400)
RBC: 3.29 MIL/uL — ABNORMAL LOW (ref 4.22–5.81)
RDW: 13.2 % (ref 11.5–15.5)
WBC: 6.4 10*3/uL (ref 4.0–10.5)

## 2017-08-16 LAB — LIPASE, BLOOD: LIPASE: 44 U/L (ref 11–51)

## 2017-08-16 LAB — I-STAT TROPONIN, ED: Troponin i, poc: 0 ng/mL (ref 0.00–0.08)

## 2017-08-16 LAB — I-STAT CG4 LACTIC ACID, ED: LACTIC ACID, VENOUS: 1.5 mmol/L (ref 0.5–1.9)

## 2017-08-16 LAB — MAGNESIUM: Magnesium: 1.3 mg/dL — ABNORMAL LOW (ref 1.7–2.4)

## 2017-08-16 MED ORDER — DONEPEZIL HCL 5 MG PO TABS
5.0000 mg | ORAL_TABLET | Freq: Every day | ORAL | Status: DC
  Filled 2017-08-16: qty 1

## 2017-08-16 MED ORDER — MORPHINE SULFATE (PF) 2 MG/ML IV SOLN
1.0000 mg | Freq: Once | INTRAVENOUS | Status: AC
  Administered 2017-08-16: 1 mg via INTRAVENOUS
  Filled 2017-08-16: qty 1

## 2017-08-16 MED ORDER — PANTOPRAZOLE SODIUM 40 MG IV SOLR
40.0000 mg | Freq: Once | INTRAVENOUS | Status: AC
  Administered 2017-08-16: 40 mg via INTRAVENOUS
  Filled 2017-08-16: qty 40

## 2017-08-16 MED ORDER — ADULT MULTIVITAMIN LIQUID CH
15.0000 mL | Freq: Every day | ORAL | Status: DC
  Administered 2017-08-17: 15 mL via ORAL
  Filled 2017-08-16 (×4): qty 15

## 2017-08-16 MED ORDER — MAGNESIUM SULFATE 2 GM/50ML IV SOLN
2.0000 g | Freq: Once | INTRAVENOUS | Status: AC
  Administered 2017-08-16: 2 g via INTRAVENOUS
  Filled 2017-08-16: qty 50

## 2017-08-16 MED ORDER — HEPARIN SODIUM (PORCINE) 5000 UNIT/ML IJ SOLN
5000.0000 [IU] | Freq: Three times a day (TID) | INTRAMUSCULAR | Status: DC
  Administered 2017-08-16 – 2017-08-17 (×3): 5000 [IU] via SUBCUTANEOUS
  Filled 2017-08-16 (×3): qty 1

## 2017-08-16 MED ORDER — ONDANSETRON HCL 4 MG PO TABS: 4.0000 mg | ORAL_TABLET | Freq: Four times a day (QID) | ORAL | Status: DC | PRN

## 2017-08-16 MED ORDER — ASPIRIN EC 81 MG PO TBEC
81.0000 mg | DELAYED_RELEASE_TABLET | Freq: Every day | ORAL | Status: DC
  Administered 2017-08-17: 81 mg via ORAL
  Filled 2017-08-16: qty 1

## 2017-08-16 MED ORDER — ACETAMINOPHEN 650 MG RE SUPP: 650.0000 mg | Freq: Four times a day (QID) | RECTAL | Status: DC | PRN

## 2017-08-16 MED ORDER — ONDANSETRON HCL 4 MG/2ML IJ SOLN: 4.0000 mg | INTRAMUSCULAR | Status: DC | PRN

## 2017-08-16 MED ORDER — FAMOTIDINE IN NACL 20-0.9 MG/50ML-% IV SOLN
20.0000 mg | INTRAVENOUS | Status: DC
  Administered 2017-08-16: 20 mg via INTRAVENOUS
  Filled 2017-08-16: qty 50

## 2017-08-16 MED ORDER — ADULT MULTIVITAMIN LIQUID CH
15.0000 mL | Freq: Every day | ORAL | Status: DC
  Filled 2017-08-16: qty 15

## 2017-08-16 MED ORDER — SODIUM CHLORIDE 0.9 % IV BOLUS (SEPSIS)
1000.0000 mL | Freq: Once | INTRAVENOUS | Status: AC
  Administered 2017-08-16: 1000 mL via INTRAVENOUS

## 2017-08-16 MED ORDER — ACETAMINOPHEN 325 MG PO TABS: 650.0000 mg | ORAL_TABLET | Freq: Four times a day (QID) | ORAL | Status: DC | PRN

## 2017-08-16 MED ORDER — DICYCLOMINE HCL 10 MG PO CAPS
10.0000 mg | ORAL_CAPSULE | Freq: Three times a day (TID) | ORAL | Status: DC | PRN
  Administered 2017-08-16: 10 mg via ORAL
  Filled 2017-08-16: qty 1

## 2017-08-16 MED ORDER — SODIUM CHLORIDE 0.9 % IV SOLN
INTRAVENOUS | Status: DC
  Administered 2017-08-16 (×2): via INTRAVENOUS

## 2017-08-16 MED ORDER — CERTA-VITE PO LIQD
5.0000 mL | Freq: Every day | ORAL | Status: DC
  Filled 2017-08-16: qty 5

## 2017-08-16 NOTE — ED Triage Notes (Addendum)
Pt was brought in by EMS due to nausea and vomiting. Pt  Completed Zpack for sinus congestion. CBG 234. Pt started vomiting last night. Sates vomited approx 8 times. Described as greenish bile. Pain upper abdomen prior to vomitng. Zofran 4 mg IV adm by EMS

## 2017-08-16 NOTE — H&P (Signed)
TRH H&P   Patient Demographics:    Jerome Chambers, is a 84 y.o. male  MRN: [id number]   DOB - [birth year]  Admit Date - 08/16/2017  Outpatient Primary MD for the patient is Rogers Blocker, MD  Referring MD: Dr. Melina Copa  Outpatient Specialists: None  Patient coming from: Home  Chief Complaint  Patient presents with  . Emesis      HPI:    Jerome Chambers  is a 84 y.o. male, with history of hypertension, type 2 diabetes mellitus on metformin, senile dementia, diverticulosis who was brought to the ED by his wife for several episodes of nausea with vomiting and 3 episodes of diarrhea at home since yesterday.  Patient had sinus congestion and runny nose for almost 3 weeks and his PCP prescribed him 5 days of Z-Pak which he recently completed.  He felt fine yesterday but then during the night he started having several episodes (5-6 times) of vomiting of dark material associated with epigastric pain.  Denied noticing any blood or coffee-ground material in the vomitus.  He also had 3 episodes of watery diarrhea during the night.  Wife reports that he has had poor appetite recently and had some chills as well.  Patient also has knee pain for which she was taking Tylenol.  Denies any recent trauma and has appointment with orthopedics next week.  Patient denies any fevers, , headache, blurred vision, dizziness, chest pain, shortness of breath, palpitations, dysuria, tingling or numbness of extremities.  Denies any new medications besides the antibiotics.  Denies using NSAIDs over-the-counter.  Course in the ED Vitals were stable.  Blood work showed hemoglobin of 10.8, BUN of 31 and creatinine of 2.02 (last labs in the system several years ago).  Blood glucose of 218, lactic acid of 1.5.  Magnesium of 1.3.  Chest x-ray was negative for infiltrate.  UA negative for infection.  Patient given 2 L IV normal saline  bolus, IV PPI, IV magnesium and hospitalist consulted for observation for acute gastroenteritis and acute kidney injury.   Review of systems:    In addition to the HPI above,  No Fever, chills + No Headache, No changes with Vision or hearing, No problems swallowing food or Liquids, No Chest pain, Cough or Shortness of Breath, Epigastric pain++, nausea+++, vomiting+++, diarrhea++ No Blood in stool or Urine, No dysuria, No new skin rashes or bruises, Right knee pain,  No new weakness, tingling, numbness in any extremity, No recent weight gain or loss, No polyuria, polydypsia or polyphagia, No significant Mental Stressors.     With Past History of the following :    Past Medical History:  Diagnosis Date  . Allergic rhinitis, cause unspecified   . Diabetes ([place])   . Diverticulosis   . DJD (degenerative joint disease)   . Glaucoma   . History of Helicobacter pylori infection   .  Sleep apnea    NO CPAP MACHINE   . Unspecified essential hypertension       History reviewed. No pertinent surgical history.    Social History:     Social History   Tobacco Use  . Smoking status: Never Smoker  . Smokeless tobacco: Never Used  Substance Use Topics  . Alcohol use: No     Lives -home with wife  Mobility -independent     Family History :     Family History  Problem Relation Age of Onset  . Colon cancer Neg Hx       Home Medications:   Prior to Admission medications   Medication Sig Start Date End Date Taking? Authorizing Provider  aspirin 81 MG tablet Take 81 mg by mouth daily.   Yes [provider]  azithromycin (ZITHROMAX) 250 MG tablet TAKE 2 TABLETS BY MOUTH TODAY, THEN TAKE 1 TABLET DAILY FOR 4 DAYS 08/10/17  Yes [provider]  cholecalciferol (VITAMIN D) 1000 UNITS tablet Take 1,000 Units by mouth daily.   Yes [provider]  donepezil (ARICEPT) 5 MG tablet Take 5 mg by mouth at bedtime.   Yes [provider]    hydrochlorothiazide (HYDRODIURIL) 25 MG tablet Take 25 mg by mouth daily.   Yes [provider]  lisinopril (PRINIVIL,ZESTRIL) 40 MG tablet Take 1 tablet by mouth daily. 05/31/17  Yes [provider]  loratadine (CLARITIN) 10 MG tablet Take 10 mg by mouth daily.   Yes [provider]  metFORMIN (GLUCOPHAGE) 1000 MG tablet Take 1,000 mg by mouth 2 (two) times daily with a meal.   Yes [provider]  multivitamin with minerals (CERTA-VITE) LIQD Take 5 mLs by mouth daily.   Yes [provider]     Allergies:    No Known Allergies   Physical Exam:   Vitals  Blood pressure 134/74, pulse 78, temperature 99.1 F (37.3 C), temperature source Oral, resp. rate 18, height 5\' 11"  (1.803 m), weight 78.9 kg (174 lb), SpO2 98 %.   General: Elderly male lying in bed, appears fatigued and sleepy HEENT: Pupils reactive bilaterally, EOMI, pallor present, no icterus, dry oral mucosa, supple neck Chest: Clear to auscultation bilaterally, no added sounds CVS: Normal S1 and S2, no murmurs rubs or gallop GI: Soft, nondistended, no epigastric or right upper quadrant tenderness, bowel sounds present Musculoskeletal: Warm, no edema CNS: Sleepy but easily arousable and oriented, no focal deficit   Data Review:    CBC Recent Labs  Lab 08/16/17 0852  WBC 6.4  HGB 10.8*  HCT 32.4*  PLT 233  MCV 98.5  MCH 32.8  MCHC 33.3  RDW 13.2  LYMPHSABS 0.5*  MONOABS 0.2  EOSABS 0.0  BASOSABS 0.0   ------------------------------------------------------------------------------------------------------------------  Chemistries  Recent Labs  Lab 08/16/17 0852  NA 138  K 4.6  CL 102  CO2 21*  GLUCOSE 218*  BUN 31*  CREATININE 2.02*  CALCIUM 9.6  MG 1.3*  AST 22  ALT 16*  ALKPHOS 65  BILITOT 1.0   ------------------------------------------------------------------------------------------------------------------ estimated creatinine clearance is 28  mL/min (A) (by C-G formula based on SCr of 2.02 mg/dL (H)). ------------------------------------------------------------------------------------------------------------------ No results for input(s): TSH, T4TOTAL, T3FREE, THYROIDAB in the last 72 hours.  Invalid input(s): FREET3  Coagulation profile No results for input(s): INR, PROTIME in the last 168 hours. ------------------------------------------------------------------------------------------------------------------- No results for input(s): DDIMER in the last 72 hours. -------------------------------------------------------------------------------------------------------------------  Cardiac Enzymes No results for input(s): CKMB, TROPONINI, MYOGLOBIN in the last  168 hours.  Invalid input(s): CK ------------------------------------------------------------------------------------------------------------------ No results found for: BNP   ---------------------------------------------------------------------------------------------------------------  Urinalysis    Component Value Date/Time   COLORURINE YELLOW 08/16/2017 1050   APPEARANCEUR CLEAR 08/16/2017 1050   LABSPEC 1.015 08/16/2017 1050   PHURINE 5.0 08/16/2017 1050   GLUCOSEU 50 (A) 08/16/2017 1050   HGBUR SMALL (A) 08/16/2017 1050   BILIRUBINUR NEGATIVE 08/16/2017 1050   KETONESUR 20 (A) 08/16/2017 1050   PROTEINUR 100 (A) 08/16/2017 1050   NITRITE NEGATIVE 08/16/2017 1050   LEUKOCYTESUR NEGATIVE 08/16/2017 1050    ----------------------------------------------------------------------------------------------------------------   Imaging Results:    No results found.  My personal review of EKG: Normal sinus rhythm at 90 with frequent PVCs.   Assessment & Plan:    Principal Problem:   Acute gastroenteritis Suspect viral.  No further diarrhea since admission.  Keep n.p.o.  IV hydration with normal saline (received 2 L in the ED).  Supportive care with IV Zofran  and Tylenol. If has further diarrhea, I will send stool for C. difficile.  Active Problems:     Acute kidney injury ([place]) No recent baseline in the system.  Hold HCTZ, lisinopril and metformin.  Avoid nephrotoxic agents.  Will contact his PCP office to obtain recent labs.    Unspecified glaucoma Continue home medication    Essential hypertension Blood pressure stable.  Hold HCTZ and lisinopril given acute kidney injury    Senile dementia Continue Aricept    Type 2 diabetes mellitus without complication (HCC) Monitor on SSI every 4 hours while n.p.o.  Hold metformin given AK I.   1.    DVT Prophylaxis Heparin -   AM Labs Ordered, also please review Full Orders  Family Communication: Admission, patients condition and plan of care including tests being ordered have been discussed with the patient, his wife and children at bedside   Code Status full code  Likely DC to home tomorrow if improved  Condition: [place] called: None  Admission status: Observation  Time spent in minutes : 50   Leeman Johnsey M.D on 08/16/2017 at 11:59 AM  Between 7am to 7pm - Pager - [phone]. After 7pm go to [email or web address] - password [facility]  [facility] - Office  [phone]

## 2017-08-16 NOTE — ED Provider Notes (Signed)
[facility] EMERGENCY DEPARTMENT Provider Note   CSN: [id number] Arrival date & time: 08/16/17  8333     History   Chief Complaint Chief Complaint  Patient presents with  . Emesis    HPI Jerome Chambers is a 84 y.o. male.  The history is provided by the patient and the spouse.  Emesis   This is a new problem. The current episode started 6 to 12 hours ago. The problem occurs 5 to 10 times per day. The problem has not changed since onset.The emesis has an appearance of stomach contents and bilious material. There has been no fever. Associated symptoms include abdominal pain and diarrhea. Pertinent negatives include no arthralgias, no chills, no cough, no fever, no headaches and no myalgias.    84 year old male brought in by EMS for multiple episodes of vomiting and loose stools since last night.  Patient states he thinks he vomited 5-6 times some dark material.  It is associated with upper abdominal pain just prior to vomiting.  He has not seen any blood in the vomit or diarrhea.  He states he is recently been on antibiotics but is not really sure for what.  Wife is now present and states the patient for 2 weeks has had upper respiratory symptoms with sinus congestion and rhinorrhea associated with some blood.  They called the PCP and he was treated with 5 days of a Z-Pak.  Just finished that yesterday.  He baseline does not have a great appetite for the last few years.  She states he is also complaining of some right knee pain for a few days.  He does not recall any trauma.  There were get a follow-up with orthopedist next week about that.  Past Medical History:  Diagnosis Date  . Allergic rhinitis, cause unspecified   . Diabetes   . Diverticulosis   . DJD (degenerative joint disease)   . Glaucoma   . History of Helicobacter pylori infection   . Sleep apnea    NO CPAP MACHINE   . Unspecified essential hypertension     Patient Active Problem List   Diagnosis Date Noted  . Special  screening for malignant neoplasms, colon 07/03/2012  . ACHILLES BURSITIS OR TENDINITIS 02/07/2010  . OTHER SYMPTOMS INVOLVING DIGESTIVE SYSTEM OTHER 01/21/2009  . DIVERTICULOSIS OF COLON 11/16/2008  . SLEEP APNEA 11/16/2008  . HELICOBACTER PYLORI GASTRITIS 11/12/2008  . DM 11/12/2008  . GLAUCOMA 11/12/2008  . HYPERTENSION 11/12/2008  . ALLERGIC RHINITIS 11/12/2008  . DEGENERATIVE JOINT DISEASE 11/12/2008  . LOW BACK PAIN SYNDROME 11/12/2008    No past surgical history on file.     Home Medications    Prior to Admission medications   Medication Sig Start Date End Date Taking? Authorizing Provider  aspirin 81 MG tablet Take 81 mg by mouth daily.    [provider]  cholecalciferol (VITAMIN D) 1000 UNITS tablet Take 1,000 Units by mouth daily.    [provider]  hydrochlorothiazide (HYDRODIURIL) 25 MG tablet Take 25 mg by mouth daily.    [provider]  lisinopril (PRINIVIL,ZESTRIL) 10 MG tablet Take 40 mg by mouth daily.     [provider]  loratadine (CLARITIN) 10 MG tablet Take 10 mg by mouth daily.    [provider]  metFORMIN (GLUCOPHAGE) 1000 MG tablet Take 1,000 mg by mouth 2 (two) times daily with a meal.    [provider]  multivitamin with minerals (CERTA-VITE) LIQD Take 5 mLs by mouth daily.  [provider]  Na Sulfate-K Sulfate-Mg Sulf (SUPREP BOWEL PREP) SOLN Take 1 kit by mouth once. 07/03/12   Inda Castle, MD    Family History Family History  Problem Relation Age of Onset  . Colon cancer Neg Hx     Social History Social History   Tobacco Use  . Smoking status: Never Smoker  . Smokeless tobacco: Never Used  Substance Use Topics  . Alcohol use: No  . Drug use: No     Allergies   Patient has no known allergies.   Review of Systems Review of Systems  Constitutional: Negative for chills and fever.  HENT: Negative for ear pain and sore throat.   Eyes: Negative for pain and  visual disturbance.  Respiratory: Negative for cough and shortness of breath.   Cardiovascular: Negative for chest pain and palpitations.  Gastrointestinal: Positive for abdominal pain, diarrhea and vomiting.  Genitourinary: Negative for dysuria and hematuria.  Musculoskeletal: Negative for arthralgias, back pain and myalgias.  Skin: Negative for color change and rash.  Neurological: Negative for seizures, syncope and headaches.  All other systems reviewed and are negative.    Physical Exam Updated Vital Signs BP 134/80 (BP Location: Right Arm)   Pulse 77   Temp 99.1 F (37.3 C) (Oral)   Resp 19   Wt 82.6 kg (182 lb)   SpO2 99%   BMI 25.38 kg/m   Physical Exam  Constitutional: He appears well-developed and well-nourished.  HENT:  Head: Normocephalic and atraumatic.  Mouth/Throat: No oropharyngeal exudate.  Eyes: Conjunctivae are normal.  Neck: Neck supple.  Cardiovascular: Normal rate and regular rhythm.  No murmur heard. Pulmonary/Chest: Effort normal and breath sounds normal. No respiratory distress.  Abdominal: Soft. He exhibits no distension and no mass. There is no tenderness. There is no rebound and no guarding. No hernia.  Musculoskeletal: He exhibits no edema.       Right knee: He exhibits swelling. He exhibits no deformity, no laceration and no bony tenderness. Tenderness (proximal to patella, no joint line tenderness, no boney tenderness) found. No medial joint line and no lateral joint line tenderness noted.  Neurological: He is alert.  Skin: Skin is warm and dry. Capillary refill takes less than 2 seconds.  Psychiatric: He has a normal mood and affect.  Nursing note and vitals reviewed.    ED Treatments / Results  Labs (all labs ordered are listed, but only abnormal results are displayed) Labs Reviewed  COMPREHENSIVE METABOLIC PANEL - Abnormal; Notable for the following components:      Result Value   CO2 21 (*)    Glucose, Bld 218 (*)    BUN 31 (*)     Creatinine, Ser 2.02 (*)    Total Protein 8.2 (*)    ALT 16 (*)    GFR calc non Af Amer 28 (*)    GFR calc Af Amer 33 (*)    All other components within normal limits  CBC WITH DIFFERENTIAL/PLATELET - Abnormal; Notable for the following components:   RBC 3.29 (*)    Hemoglobin 10.8 (*)    HCT 32.4 (*)    Lymphs Abs 0.5 (*)    All other components within normal limits  MAGNESIUM - Abnormal; Notable for the following components:   Magnesium 1.3 (*)    All other components within normal limits  URINALYSIS, ROUTINE W REFLEX MICROSCOPIC - Abnormal; Notable for the following components:   Glucose, UA 50 (*)    Hgb urine dipstick  SMALL (*)    Ketones, ur 20 (*)    Protein, ur 100 (*)    All other components within normal limits  LIPASE, BLOOD  BASIC METABOLIC PANEL  CBC  I-STAT TROPONIN, ED  I-STAT CG4 LACTIC ACID, ED    EKG  EKG Interpretation  Date/Time:  Thursday August 16 2017 08:22:10 EST Ventricular Rate:  90 PR Interval:    QRS Duration: 107 QT Interval:  380 QTC Calculation: 465 R Axis:   76 Text Interpretation:  Sinus rhythm Multiple ventricular premature complexes no acute st/ts similar to prior except for new PVCs, compared with 5/11 Confirmed by Aletta Edouard [phone]) on 08/16/2017 8:29:05 AM       Radiology No results found.  Procedures Procedures (including critical care time)  Medications Ordered in ED Medications  sodium chloride 0.9 % bolus 1,000 mL (not administered)     Initial Impression / Assessment and Plan / ED Course  I have reviewed the triage vital signs and the nursing notes.  Pertinent labs & imaging results that were available during my care of the patient were reviewed by me and considered in my medical decision making (see chart for details).  Clinical Course as of Aug 16 1930  Thu Aug 16, 2017  1008 Discussed with family discussed with family the patient's lab work and need for him to be in the hospital for continued management.   They were comfortable with the plan.  I called hospitalist on-call Dr Clementeen Graham who accepts the patient for admission  [MB]    Clinical Course User Index [MB] Hayden Rasmussen, MD      Final Clinical Impressions(s) / ED Diagnoses   Final diagnoses:  Nausea vomiting and diarrhea  AKI (acute kidney injury) [facility])    ED Discharge Orders    None       Hayden Rasmussen, MD 08/16/17 [phone]

## 2017-08-16 NOTE — Care Management Obs Status (Signed)
[place] NOTIFICATION   Patient Details  Name: Jerome Chambers MRN: [id number] Date of Birth: 1928/08/23   Medicare Observation Status Notification Given:  Yes    Estee Yohe, Chauncey Reading, RN 08/16/2017, 3:13 PM

## 2017-08-17 ENCOUNTER — Encounter (HOSPITAL_COMMUNITY): Payer: Self-pay | Admitting: *Deleted

## 2017-08-17 ENCOUNTER — Emergency Department (HOSPITAL_COMMUNITY)
Admission: EM | Admit: 2017-08-17 | Discharge: 2017-08-17 | Disposition: A | Discharge status: Home / Self Care | Payer: Medicare Other | Source: Home / Self Care | Attending: Emergency Medicine | Admitting: Emergency Medicine

## 2017-08-17 ENCOUNTER — Other Ambulatory Visit: Payer: Self-pay

## 2017-08-17 DIAGNOSIS — N179 Acute kidney failure, unspecified: Secondary | ICD-10-CM | POA: Diagnosis present

## 2017-08-17 DIAGNOSIS — Z7982 Long term (current) use of aspirin: Secondary | ICD-10-CM | POA: Insufficient documentation

## 2017-08-17 DIAGNOSIS — M25561 Pain in right knee: Secondary | ICD-10-CM

## 2017-08-17 DIAGNOSIS — R197 Diarrhea, unspecified: Secondary | ICD-10-CM

## 2017-08-17 DIAGNOSIS — N178 Other acute kidney failure: Secondary | ICD-10-CM

## 2017-08-17 DIAGNOSIS — E119 Type 2 diabetes mellitus without complications: Secondary | ICD-10-CM

## 2017-08-17 DIAGNOSIS — I10 Essential (primary) hypertension: Secondary | ICD-10-CM

## 2017-08-17 DIAGNOSIS — R112 Nausea with vomiting, unspecified: Secondary | ICD-10-CM | POA: Diagnosis not present

## 2017-08-17 DIAGNOSIS — N183 Chronic kidney disease, stage 3 (moderate): Secondary | ICD-10-CM

## 2017-08-17 DIAGNOSIS — Z79899 Other long term (current) drug therapy: Secondary | ICD-10-CM | POA: Insufficient documentation

## 2017-08-17 DIAGNOSIS — K529 Noninfective gastroenteritis and colitis, unspecified: Secondary | ICD-10-CM | POA: Diagnosis not present

## 2017-08-17 LAB — COMPREHENSIVE METABOLIC PANEL
ALBUMIN: 3.5 g/dL (ref 3.5–5.0)
ALT: 15 U/L — ABNORMAL LOW (ref 17–63)
ANION GAP: 14 (ref 5–15)
AST: 22 U/L (ref 15–41)
Alkaline Phosphatase: 57 U/L (ref 38–126)
BILIRUBIN TOTAL: 0.7 mg/dL (ref 0.3–1.2)
BUN: 30 mg/dL — ABNORMAL HIGH (ref 6–20)
CO2: 19 mmol/L — ABNORMAL LOW (ref 22–32)
Calcium: 9.2 mg/dL (ref 8.9–10.3)
Chloride: 104 mmol/L (ref 101–111)
Creatinine, Ser: 1.96 mg/dL — ABNORMAL HIGH (ref 0.61–1.24)
GFR calc non Af Amer: 29 mL/min — ABNORMAL LOW (ref >60)
GFR, EST AFRICAN AMERICAN: 34 mL/min — AB (ref >60)
GLUCOSE: 167 mg/dL — AB (ref 65–99)
POTASSIUM: 4 mmol/L (ref 3.5–5.1)
SODIUM: 137 mmol/L (ref 135–145)
TOTAL PROTEIN: 7.2 g/dL (ref 6.5–8.1)

## 2017-08-17 LAB — CBC
HCT: 28.2 % — ABNORMAL LOW (ref 39.0–52.0)
HEMATOCRIT: 30.3 % — AB (ref 39.0–52.0)
HEMOGLOBIN: 10.6 g/dL — AB (ref 13.0–17.0)
Hemoglobin: 9.3 g/dL — ABNORMAL LOW (ref 13.0–17.0)
MCH: 32.7 pg (ref 26.0–34.0)
MCH: 34.1 pg — ABNORMAL HIGH (ref 26.0–34.0)
MCHC: 33 g/dL (ref 30.0–36.0)
MCHC: 35 g/dL (ref 30.0–36.0)
MCV: 99.3 fL (ref 78.0–100.0)
MCV: 97.4 fL (ref 78.0–100.0)
PLATELETS: 217 10*3/uL (ref 150–400)
Platelets: 244 10*3/uL (ref 150–400)
RBC: 2.84 MIL/uL — ABNORMAL LOW (ref 4.22–5.81)
RBC: 3.11 MIL/uL — AB (ref 4.22–5.81)
RDW: 13.6 % (ref 11.5–15.5)
RDW: 13.4 % (ref 11.5–15.5)
WBC: 4.1 10*3/uL (ref 4.0–10.5)
WBC: 6.1 10*3/uL (ref 4.0–10.5)

## 2017-08-17 LAB — BASIC METABOLIC PANEL
Anion gap: 10 (ref 5–15)
BUN: 28 mg/dL — ABNORMAL HIGH (ref 6–20)
CALCIUM: 8.8 mg/dL — AB (ref 8.9–10.3)
CO2: 23 mmol/L (ref 22–32)
CREATININE: 1.77 mg/dL — AB (ref 0.61–1.24)
Chloride: 106 mmol/L (ref 101–111)
GFR calc non Af Amer: 33 mL/min — ABNORMAL LOW (ref >60)
GFR, EST AFRICAN AMERICAN: 38 mL/min — AB (ref >60)
Glucose, Bld: 132 mg/dL — ABNORMAL HIGH (ref 65–99)
Potassium: 3.9 mmol/L (ref 3.5–5.1)
SODIUM: 139 mmol/L (ref 135–145)

## 2017-08-17 LAB — LIPASE, BLOOD: Lipase: 49 U/L (ref 11–51)

## 2017-08-17 LAB — CBG MONITORING, ED: Glucose-Capillary: 158 mg/dL — ABNORMAL HIGH (ref 65–99)

## 2017-08-17 LAB — MAGNESIUM: MAGNESIUM: 1.6 mg/dL — AB (ref 1.7–2.4)

## 2017-08-17 LAB — URIC ACID: URIC ACID, SERUM: 7.7 mg/dL — AB (ref 4.4–7.6)

## 2017-08-17 MED ORDER — ONDANSETRON 4 MG PO TBDP
4.0000 mg | ORAL_TABLET | Freq: Once | ORAL | Status: AC | PRN
  Administered 2017-08-17: 4 mg via ORAL
  Filled 2017-08-17: qty 1

## 2017-08-17 MED ORDER — SODIUM CHLORIDE 0.9 % IV BOLUS (SEPSIS)
500.0000 mL | Freq: Once | INTRAVENOUS | Status: AC
  Administered 2017-08-17: 500 mL via INTRAVENOUS

## 2017-08-17 MED ORDER — MORPHINE SULFATE (PF) 4 MG/ML IV SOLN: 6.0000 mg | Freq: Once | INTRAVENOUS | Status: DC

## 2017-08-17 MED ORDER — KETOROLAC TROMETHAMINE 30 MG/ML IJ SOLN
15.0000 mg | Freq: Once | INTRAMUSCULAR | Status: AC
  Administered 2017-08-17: 15 mg via INTRAVENOUS
  Filled 2017-08-17: qty 1

## 2017-08-17 MED ORDER — OXYCODONE-ACETAMINOPHEN 5-325 MG PO TABS
1.0000 | ORAL_TABLET | Freq: Once | ORAL | Status: AC
  Administered 2017-08-17: 1 via ORAL
  Filled 2017-08-17: qty 1

## 2017-08-17 MED ORDER — HYDROCODONE-ACETAMINOPHEN 5-325 MG PO TABS: 1.0000 | ORAL_TABLET | Freq: Four times a day (QID) | ORAL | 0 refills | Status: DC | PRN

## 2017-08-17 MED ORDER — TRAMADOL HCL 50 MG PO TABS: 50.0000 mg | ORAL_TABLET | Freq: Four times a day (QID) | ORAL | 0 refills | Status: DC | PRN

## 2017-08-17 MED ORDER — TRAMADOL HCL 50 MG PO TABS: 50.0000 mg | ORAL_TABLET | Freq: Two times a day (BID) | ORAL | 0 refills | Status: DC | PRN

## 2017-08-17 MED ORDER — MORPHINE SULFATE (PF) 4 MG/ML IV SOLN
4.0000 mg | Freq: Once | INTRAVENOUS | Status: AC
  Administered 2017-08-17: 4 mg via INTRAVENOUS
  Filled 2017-08-17: qty 1

## 2017-08-17 NOTE — ED Notes (Signed)
Updated that pt will be moved to hallway bed.

## 2017-08-17 NOTE — ED Provider Notes (Signed)
[facility] EMERGENCY DEPARTMENT Provider Note   CSN: [id number] Arrival date & time: 08/17/17  1826     History   Chief Complaint Chief Complaint  Patient presents with  . Emesis  . Leg Pain    HPI Jerome Chambers is a 84 y.o. male.  Patient is an 84 year old male who presents with knee pain.  He has a history of dementia most of the history is obtained through the patient's wife.  She notes that he has not felt well for about 2 weeks.  It started with a cough and cold symptoms.  His doctor called him in a Z-Pak and he finished this.  However over the last couple of days he has had some worsening nausea vomiting and diarrhea.  He was admitted to [facility] for these reasons and he had a mild acute kidney injury.  The wife states that his symptoms have greatly improved although he has ongoing pain to his right knee.  He was discharged from [facility] today and was doing fine this morning, ambulating without difficulty however once he got home he started complaining of worsening pain to his right knee which is why she brought him back.  His knee pain is been going on for about 2 weeks.  She has not noticed any swelling in the knee.  No known injuries to the knee.  He has an appointment with an orthopedist in [place] later this week.  He has been taking Tylenol at home for the pain.  This has not improved his symptoms.      Past Medical History:  Diagnosis Date  . Allergic rhinitis, cause unspecified   . Diabetes ([place])   . Diverticulosis   . DJD (degenerative joint disease)   . Glaucoma   . History of Helicobacter pylori infection   . Sleep apnea    NO CPAP MACHINE   . Unspecified essential hypertension     Patient Active Problem List   Diagnosis Date Noted  . Acute renal failure superimposed on stage 3 chronic kidney disease ([place]) 08/17/2017  . Acute kidney failure ([place]) 08/16/2017  . Senile dementia 08/16/2017  . Acute gastroenteritis 08/16/2017  .  Type 2 diabetes mellitus without complication ([place]) 94/17/4081  . Special screening for malignant neoplasms, colon 07/03/2012  . ACHILLES BURSITIS OR TENDINITIS 02/07/2010  . OTHER SYMPTOMS INVOLVING DIGESTIVE SYSTEM OTHER 01/21/2009  . DIVERTICULOSIS OF COLON 11/16/2008  . SLEEP APNEA 11/16/2008  . HELICOBACTER PYLORI GASTRITIS 11/12/2008  . DM 11/12/2008  . Unspecified glaucoma 11/12/2008  . Essential hypertension 11/12/2008  . ALLERGIC RHINITIS 11/12/2008  . Osteoarthritis 11/12/2008  . LOW BACK PAIN SYNDROME 11/12/2008    History reviewed. No pertinent surgical history.     Home Medications    Prior to Admission medications   Medication Sig Start Date End Date Taking? Authorizing Provider  aspirin 81 MG tablet Take 81 mg by mouth daily.    [provider]  cholecalciferol (VITAMIN D) 1000 UNITS tablet Take 1,000 Units by mouth daily.    [provider]  donepezil (ARICEPT) 5 MG tablet Take 5 mg by mouth at bedtime.    [provider]  HYDROcodone-acetaminophen (NORCO/VICODIN) 5-325 MG tablet Take 1-2 tablets by mouth every 6 (six) hours as needed. 08/17/17   Malvin Johns, MD  loratadine (CLARITIN) 10 MG tablet Take 10 mg by mouth daily.    [provider]  multivitamin with minerals (CERTA-VITE) LIQD Take 5 mLs by mouth daily.  [provider]  traMADol (ULTRAM) 50 MG tablet Take 1 tablet (50 mg total) by mouth every 12 (twelve) hours as needed. 08/17/17   Malvin Johns, MD    Family History Family History  Problem Relation Age of Onset  . Colon cancer Neg Hx     Social History Social History   Tobacco Use  . Smoking status: Never Smoker  . Smokeless tobacco: Never Used  Substance Use Topics  . Alcohol use: No  . Drug use: No     Allergies   Patient has no known allergies.   Review of Systems Review of Systems  Unable to perform ROS: Dementia     Physical Exam Updated Vital Signs BP (!) 151/84 (BP  Location: Right Arm)   Pulse 73   Temp 98.9 F (37.2 C) (Oral)   Resp 18   SpO2 100%   Physical Exam  Constitutional: He is oriented to person, place, and time. He appears well-developed and well-nourished.  HENT:  Head: Normocephalic and atraumatic.  Eyes: Pupils are equal, round, and reactive to light.  Neck: Normal range of motion. Neck supple.  Cardiovascular: Normal rate, regular rhythm and normal heart sounds.  Pulmonary/Chest: Effort normal and breath sounds normal. No respiratory distress. He has no wheezes. He has no rales. He exhibits no tenderness.  Abdominal: Soft. Bowel sounds are normal. There is no tenderness. There is no rebound and no guarding.  Musculoskeletal: Normal range of motion. He exhibits no edema.  Patient has tenderness over the quadriceps tendon area of the right knee.  There is mild warmth to the area but no swelling.  No effusion noted.  He has full range of motion of the knee.  No bony tenderness is noted.  There is no pain to the hip or the ankle.  Pedal pulses are intact.  Lymphadenopathy:    He has no cervical adenopathy.  Neurological: He is alert and oriented to person, place, and time.  Skin: Skin is warm and dry. No rash noted.  Psychiatric: He has a normal mood and affect.     ED Treatments / Results  Labs (all labs ordered are listed, but only abnormal results are displayed) Labs Reviewed  COMPREHENSIVE METABOLIC PANEL - Abnormal; Notable for the following components:      Result Value   CO2 19 (*)    Glucose, Bld 167 (*)    BUN 30 (*)    Creatinine, Ser 1.96 (*)    ALT 15 (*)    GFR calc non Af Amer 29 (*)    GFR calc Af Amer 34 (*)    All other components within normal limits  CBC - Abnormal; Notable for the following components:   RBC 3.11 (*)    Hemoglobin 10.6 (*)    HCT 30.3 (*)    MCH 34.1 (*)    All other components within normal limits  URIC ACID - Abnormal; Notable for the following components:   Uric Acid, Serum 7.7  (*)    All other components within normal limits  CBG MONITORING, ED - Abnormal; Notable for the following components:   Glucose-Capillary 158 (*)    All other components within normal limits  LIPASE, BLOOD    EKG  EKG Interpretation None       Radiology Dg Knee Right Port  Result Date: 08/17/2017 CLINICAL DATA:  Right knee pain wakes him up at night.  No injury. EXAM: PORTABLE RIGHT KNEE - 1-2 VIEW COMPARISON:  None. FINDINGS: No  evidence of fracture, dislocation, or joint effusion. No evidence of arthropathy or other focal bone abnormality. Soft tissues are unremarkable. IMPRESSION: Negative. Electronically Signed   By: Lucienne Capers M.D.   On: 08/17/2017 00:23    Procedures Procedures (including critical care time)  Medications Ordered in ED Medications  sodium chloride 0.9 % bolus 500 mL (500 mLs Intravenous New Bag/Given 08/17/17 2133)  ondansetron (ZOFRAN-ODT) disintegrating tablet 4 mg (4 mg Oral Given 08/17/17 1839)  oxyCODONE-acetaminophen (PERCOCET/ROXICET) 5-325 MG per tablet 1 tablet (1 tablet Oral Given 08/17/17 2019)  morphine 4 MG/ML injection 4 mg (4 mg Intravenous Given 08/17/17 2133)  ketorolac (TORADOL) 30 MG/ML injection 15 mg (15 mg Intravenous Given 08/17/17 2133)     Initial Impression / Assessment and Plan / ED Course  I have reviewed the triage vital signs and the nursing notes.  Pertinent labs & imaging results that were available during my care of the patient were reviewed by me and considered in my medical decision making (see chart for details).     Patient presents with right knee pain.  He recently was admitted for gastroenteritis and discharged this morning.  His wife says that he has been doing well from that standpoint.  However he has been complaining of worsening pain to his right knee.  He has no known history of gout.  I do not see any evidence of a joint effusion or concerns for septic joint.  It is mildly warm to the touch but there is no  erythema or overlying signs of infection.  I advised patient to use ice to the area.  His pain was controlled in the emergency department.  He was given prescriptions for tramadol.  His wife is concerned that the tramadol might not be effective and I did give him a prescription for Vicodin to use only if the tramadol is not effective.  The wife knows not to give them both together.  They have a follow-up appointment next week with orthopedics.  Return precautions were given.  Final Clinical Impressions(s) / ED Diagnoses   Final diagnoses:  Acute pain of right knee    ED Discharge Orders        Ordered    traMADol (ULTRAM) 50 MG tablet  Every 6 hours PRN,   Status:  Discontinued     08/17/17 2225    HYDROcodone-acetaminophen (NORCO/VICODIN) 5-325 MG tablet  Every 6 hours PRN,   Status:  Discontinued     08/17/17 2225    traMADol (ULTRAM) 50 MG tablet  Every 12 hours PRN     08/17/17 2227    HYDROcodone-acetaminophen (NORCO/VICODIN) 5-325 MG tablet  Every 6 hours PRN     08/17/17 2227       Malvin Johns, MD 08/17/17 2229

## 2017-08-17 NOTE — ED Notes (Signed)
Per main lab, will add on uric acid

## 2017-08-17 NOTE — Progress Notes (Signed)
Inpatient Diabetes Program Recommendations  AACE/ADA: New Consensus Statement on Inpatient Glycemic Control (2015)  Target Ranges:  Prepandial:   less than 140 mg/dL      Peak postprandial:   less than 180 mg/dL (1-2 hours)      Critically ill patients:  140 - 180 mg/dL   Results for Jerome Chambers, Jerome Chambers (MRN [id number]) as of 08/17/2017 08:30  Ref. Range 08/16/2017 08:52  Glucose Latest Ref Range: 65 - 99 mg/dL 218 (H)   Review of Glycemic Control  Diabetes history: DM2 Outpatient Diabetes medications: Metformin 1000 mg BID Current orders for Inpatient glycemic control: None  Inpatient Diabetes Program Recommendations: Correction (SSI): While inpatient, please consider ordering CBGs with Novolog correction scale Q4H.  Thanks, Barnie Alderman, RN, MSN, CDE Diabetes Coordinator Inpatient Diabetes Program [phone] (Team Pager from 8am to 5pm)

## 2017-08-17 NOTE — ED Triage Notes (Signed)
Pt was just dc from [facility] today following gi virus. Onset approx 2 hours ago had return of severe right leg and knee pain along with n/v. Denies diarrhea.

## 2017-08-17 NOTE — ED Notes (Signed)
VSS. Pt and family verbalized understanding of pain management, prescriptions, and d/c instructions as well as f/u. Pt wheeled to lobby. All belongings with pt wife

## 2017-08-17 NOTE — Discharge Summary (Signed)
Physician Discharge Summary  Jerome Chambers YNW:[id number] DOB: 09-15-1928 DOA: 08/16/2017  PCP: Rogers Blocker, MD  Admit date: 08/16/2017 Discharge date: 08/17/2017  Admitted From: Home Disposition: Home  Recommendations for Outpatient Follow-up:  1 patient has follow-up appointment with PCP on 2/4. 2 given his chronic kidney disease (stage III) I have discontinued his HCTZ, lisinopril and metformin.   Home Health: None Equipment/Devices: None  Discharge Condition: Fair CODE STATUS: Full code Diet recommendation: Carb modified    Discharge Diagnoses:  Principal Problem:   Acute gastroenteritis  Active Problems:   Unspecified glaucoma   Essential hypertension   Osteoarthritis   Senile dementia   Type 2 diabetes mellitus without complication ([place])   Acute renal failure superimposed on stage 3 chronic kidney disease (HCC)  Brief narrative/HPI Please refer to admission H&P for details, in brief, 84 y.o. male, with history of hypertension, type 2 diabetes mellitus on metformin, senile dementia, diverticulosis who was brought to the ED by his wife for several episodes of nausea with vomiting and 3 episodes of diarrhea at home since yesterday.  Patient had sinus congestion and runny nose for almost 3 weeks and his PCP prescribed him 5 days of Z-Pak which he recently completed.  He felt fine yesterday but then during the night he started having several episodes (5-6 times) of vomiting of dark material associated with epigastric pain.  Denied noticing any blood or coffee-ground material in the vomitus.  He also had 3 episodes of watery diarrhea during the night.  Wife reports that he has had poor appetite recently and had some chills as well.  Patient also has knee pain for which she was taking Tylenol.  Denies any recent trauma and has appointment with orthopedics next week.  Patient denies any fevers, , headache, blurred vision, dizziness, chest pain, shortness of breath, palpitations, dysuria,  tingling or numbness of extremities.  Denies any new medications besides the antibiotics.  Denies using NSAIDs over-the-counter.  Course in the ED Vitals were stable.  Blood work showed hemoglobin of 10.8, BUN of 31 and creatinine of 2.02 (last labs in the system several years ago).  Blood glucose of 218, lactic acid of 1.5.  Magnesium of 1.3.  Chest x-ray was negative for infiltrate.  UA negative for infection.  Patient given 2 L IV normal saline bolus, IV PPI, IV magnesium and hospitalist consulted for observation for acute gastroenteritis and acute kidney injury.  Hospital course  Principal Problem:   Acute gastroenteritis Suspect viral.   Symptoms resolved with hydration and supportive care with antiemetics.  Patient feels much better this morning.  Active Problems:    Acute  kidney injury on chronic kidney disease stage III (HCC) -Baseline creatinine appears to be close to 2.  (Creatinine in the past 1 year have been 2.02 and 2.07 respectively, last labs in November 2018).  Renal function mildly worsened due to dehydration with gastroenteritis. -Patient is on HCTZ, lisinopril and metformin and all of them have been held.  Given his baseline chronic kidney disease stage III I think all these medications should be discontinued.  Creatinine better this a.m. with IV hydration (1.77). -He has appointment to see his PCP early next week and should have his renal function checked at that time. -Patient and family clearly instructed to avoid any NSAIDs.    Unspecified glaucoma Continue home medication    Essential hypertension Blood pressure stable.  Holding both HCTZ and lisinopril given his acute on chronic kidney disease.  I think  he should be off HCTZ and if renal function stable as outpatient lisinopril can be resumed at a lower dose (given proteinuria).    Senile dementia Continue Aricept    Type 2 diabetes mellitus without complication (HCC) Metformin discontinued given his  worsened renal function.  His A1c from November 2018 per PCP office was 6.5.  His CBC has been stable.  Given his age and stable A1c I think he can be monitored without any medications.  If CBG remain elevated he can be started on a different oral hypoglycemic agent except metformin.  Hypomagnesemia Replenished   Patient stable to be discharged home with outpatient follow-up.  Family Communication:  Wife and daughter at bedside   Disposition: Home  Condition: Fair  Consults called: None    Discharge Instructions   Allergies as of 08/17/2017   No Known Allergies     Medication List    STOP taking these medications   azithromycin 250 MG tablet Commonly known as:  ZITHROMAX   hydrochlorothiazide 25 MG tablet Commonly known as:  HYDRODIURIL   lisinopril 40 MG tablet Commonly known as:  PRINIVIL,ZESTRIL   metFORMIN 1000 MG tablet Commonly known as:  GLUCOPHAGE     TAKE these medications   aspirin 81 MG tablet Take 81 mg by mouth daily.   cholecalciferol 1000 units tablet Commonly known as:  VITAMIN D Take 1,000 Units by mouth daily.   donepezil 5 MG tablet Commonly known as:  ARICEPT Take 5 mg by mouth at bedtime.   loratadine 10 MG tablet Commonly known as:  CLARITIN Take 10 mg by mouth daily.   multivitamin with minerals Liqd Take 5 mLs by mouth daily.      Follow-up Information    Rogers Blocker, MD Follow up on 08/20/2017.   Specialty:  Internal Medicine Why:  has appointment Contact information: [address]. [place] [address] [phone]          No Known Allergies   Procedures/Studies: Dg Knee Right Port  Result Date: 08/17/2017 CLINICAL DATA:  Right knee pain wakes him up at night.  No injury. EXAM: PORTABLE RIGHT KNEE - 1-2 VIEW COMPARISON:  None. FINDINGS: No evidence of fracture, dislocation, or joint effusion. No evidence of arthropathy or other focal bone abnormality. Soft tissues are unremarkable. IMPRESSION:  Negative. Electronically Signed   By: Lucienne Capers M.D.   On: 08/17/2017 00:23       Subjective: Feels much better today.  No nausea, vomiting or diarrhea.  Discharge Exam: Vitals:   08/16/17 2219 08/17/17 0626  BP: 132/73 106/67  Pulse: 66 66  Resp: 18 20  Temp: 98.5 F (36.9 C) 98.2 F (36.8 C)  SpO2: 99% 98%   Vitals:   08/16/17 1123 08/16/17 1652 08/16/17 2219 08/17/17 0626  BP: 134/74 124/62 132/73 106/67  Pulse: 78 80 66 66  Resp: 18 18 18 20   Temp: 99.1 F (37.3 C) 98.6 F (37 C) 98.5 F (36.9 C) 98.2 F (36.8 C)  TempSrc: Oral Oral Oral Oral  SpO2: 98% 99% 99% 98%  Weight: 78.9 kg (174 lb)     Height: 5\' 11"  (1.803 m)      General: Elderly male not in distress HEENT: Moist mucosa, supple neck Chest: Clear bilaterally CVS: Normal S1 and S2, no murmurs GI: Soft, nondistended, nontender Musculoskeletal: Warm, no edema     The results of significant diagnostics from this hospitalization (including imaging, microbiology, ancillary and laboratory) are listed below for reference.  Microbiology: No results found for this or any previous visit (from the past 240 hour(s)).   Labs: BNP (last 3 results) No results for input(s): BNP in the last 8760 hours. Basic Metabolic Panel: Recent Labs  Lab 08/16/17 0852 08/17/17 0847  NA 138 139  K 4.6 3.9  CL 102 106  CO2 21* 23  GLUCOSE 218* 132*  BUN 31* 28*  CREATININE 2.02* 1.77*  CALCIUM 9.6 8.8*  MG 1.3*  --    Liver Function Tests: Recent Labs  Lab 08/16/17 0852  AST 22  ALT 16*  ALKPHOS 65  BILITOT 1.0  PROT 8.2*  ALBUMIN 3.9   Recent Labs  Lab 08/16/17 0852  LIPASE 44   No results for input(s): AMMONIA in the last 168 hours. CBC: Recent Labs  Lab 08/16/17 0852 08/17/17 0847  WBC 6.4 4.1  NEUTROABS 5.6  --   HGB 10.8* 9.3*  HCT 32.4* 28.2*  MCV 98.5 99.3  PLT 233 217   Cardiac Enzymes: No results for input(s): CKTOTAL, CKMB, CKMBINDEX, TROPONINI in the last 168  hours. BNP: Invalid input(s): POCBNP CBG: No results for input(s): GLUCAP in the last 168 hours. D-Dimer No results for input(s): DDIMER in the last 72 hours. Hgb A1c No results for input(s): HGBA1C in the last 72 hours. Lipid Profile No results for input(s): CHOL, HDL, LDLCALC, TRIG, CHOLHDL, LDLDIRECT in the last 72 hours. Thyroid function studies No results for input(s): TSH, T4TOTAL, T3FREE, THYROIDAB in the last 72 hours.  Invalid input(s): FREET3 Anemia work up No results for input(s): VITAMINB12, FOLATE, FERRITIN, TIBC, IRON, RETICCTPCT in the last 72 hours. Urinalysis    Component Value Date/Time   COLORURINE YELLOW 08/16/2017 1050   APPEARANCEUR CLEAR 08/16/2017 1050   LABSPEC 1.015 08/16/2017 1050   PHURINE 5.0 08/16/2017 1050   GLUCOSEU 50 (A) 08/16/2017 1050   HGBUR SMALL (A) 08/16/2017 1050   BILIRUBINUR NEGATIVE 08/16/2017 1050   KETONESUR 20 (A) 08/16/2017 1050   PROTEINUR 100 (A) 08/16/2017 1050   NITRITE NEGATIVE 08/16/2017 1050   LEUKOCYTESUR NEGATIVE 08/16/2017 1050   Sepsis Labs Invalid input(s): PROCALCITONIN,  WBC,  LACTICIDVEN Microbiology No results found for this or any previous visit (from the past 240 hour(s)).   Time coordinating discharge: < 30 minutes  SIGNED:   Louellen Molder, MD  [facility] 08/17/2017, 10:32 AM Pager   If 7PM-7AM, please contact night-coverage [email or web address] Password [email or web address]

## 2017-08-17 NOTE — ED Notes (Signed)
ED Provider at bedside. 

## 2017-08-17 NOTE — ED Notes (Addendum)
Pt remains in waiting room. Updated on wait for treatment room.  Pt reports severe pain.  States Zofran helped with nausea and he is no longer gagging.

## 2017-08-17 NOTE — Progress Notes (Signed)
Initial Nutrition Assessment  DOCUMENTATION CODES:   Not applicable  INTERVENTION:   Pt/pt's family requested education on high calorie, high protein diet  Promoted adequate nutrition consumption to pt and family  NUTRITION DIAGNOSIS:   Increased nutrient needs related to acute illness as evidenced by estimated needs.  GOAL:   Patient will meet greater than or equal to 90% of their needs  MONITOR:   PO intake, Supplement acceptance, Labs, I & O's, Weight trends  REASON FOR ASSESSMENT:   Malnutrition Screening Tool    ASSESSMENT:   Pt with PMH of senile dementia, type II DM, HTN, and glaucoma presents with acute gastroenteritis   Spoke with pt, pt's wife and pt's daughter at bedside. Per report, pt's intake has been lower than normal over the past two years. Pt consumes 2 "good" meals per day, occasionally 3. Pt's family report he has lost 5 lbs in ~3 months, 3% weight loss, not significant for time frame.   Pt reports no nutrition impact symptoms at visit however has N/V and diarrhea PTA.   RD discussed at length snack and meal options to optimize calories and protein to aid in weight maintainence. Pt's wife made grocery list of some snack/meal options.   Pt to be discharged home today.   Labs reviewed; Magnesium 1.3, Hemoglobin 9.3  Medications reviewed; multivitamin, Pecid   NUTRITION - FOCUSED PHYSICAL EXAM:  Unable to complete at time of visit  Diet Order:  DIET SOFT Room service appropriate? Yes; Fluid consistency: Thin  EDUCATION NEEDS:   Education needs have been addressed  Skin:  Skin Assessment: Reviewed RN Assessment  Last BM:  08/16/17  Height:   Ht Readings from Last 1 Encounters:  08/16/17 5\' 11"  (1.803 m)   Weight:   Wt Readings from Last 1 Encounters:  08/16/17 174 lb (78.9 kg)   Ideal Body Weight:  78.2 kg  BMI:  Body mass index is 24.27 kg/m.  Estimated Nutritional Needs:   Kcal:  1950-2150  Protein:  95-105  grams  Fluid:  >/= 1.9 L/d  Parks Ranger, MS, RDN, LDN 08/17/2017 12:21 PM

## 2017-08-17 NOTE — ED Notes (Signed)
Pt wife reports pt was d/c from [facility] this AM after being dx with acute gastritis. Per pt's wife, pt was feeling much better this AM prior to d/c. Pt was able to walk and was reporting no pain or nausea. Pt wife reports that this afternoon pt became nauseous again and had a few episodes of vomiting clear liquid. Pt also reports his R knee pain has come back. Pt denies nausea at this time.

## 2017-08-22 ENCOUNTER — Ambulatory Visit: Payer: Medicare Other | Admitting: Orthopedic Surgery

## 2017-08-22 ENCOUNTER — Encounter: Payer: Self-pay | Admitting: Orthopedic Surgery

## 2017-08-22 VITALS — BP 134/76 | HR 77 | Ht 71.0 in | Wt 179.0 lb

## 2017-08-22 DIAGNOSIS — M25561 Pain in right knee: Secondary | ICD-10-CM

## 2017-08-22 MED ORDER — NAPROXEN 500 MG PO TABS: 500.0000 mg | ORAL_TABLET | Freq: Two times a day (BID) | ORAL | 2 refills | Status: DC

## 2017-08-22 NOTE — Patient Instructions (Signed)
Naprosyn 500 mg twice a day return February 18

## 2017-08-22 NOTE — Progress Notes (Signed)
Progress Note   Patient ID: Jerome Chambers, male   DOB: February 16, 1929, 84 y.o.   MRN: [id number]  Chief Complaint  Patient presents with  . Knee Pain    RIGHT    84 year old male acute onset of right knee pain during viral illness eventually went to his primary care physician for some type of medication which he has not started yet.  He complains of initially severe pain over his quadriceps tendon insertion, swelling of the joint, pain present for 2-3 weeks moderate to severe in intensity     Review of Systems  Constitutional: Negative for chills and fever.  Skin: Negative.    Current Meds  Medication Sig  . aspirin 81 MG tablet Take 81 mg by mouth daily.  . cholecalciferol (VITAMIN D) 1000 UNITS tablet Take 1,000 Units by mouth daily.  Marland Kitchen donepezil (ARICEPT) 5 MG tablet Take 5 mg by mouth at bedtime.  Marland Kitchen HYDROcodone-acetaminophen (NORCO/VICODIN) 5-325 MG tablet Take 1-2 tablets by mouth every 6 (six) hours as needed.  . loratadine (CLARITIN) 10 MG tablet Take 10 mg by mouth daily.  . multivitamin with minerals (CERTA-VITE) LIQD Take 5 mLs by mouth daily.  . traMADol (ULTRAM) 50 MG tablet Take 1 tablet (50 mg total) by mouth every 12 (twelve) hours as needed.    Past Medical History:  Diagnosis Date  . Allergic rhinitis, cause unspecified   . Diabetes ([place])   . Diverticulosis   . DJD (degenerative joint disease)   . Glaucoma   . History of Helicobacter pylori infection   . Sleep apnea    NO CPAP MACHINE   . Unspecified essential hypertension      No Known Allergies  BP 134/76   Pulse 77   Ht 5\' 11"  (1.803 m)   Wt 179 lb (81.2 kg)   BMI 24.97 kg/m    Physical Exam  Constitutional: He is oriented to person, place, and time. He appears well-developed and well-nourished.  Vital signs have been reviewed and are stable. Gen. appearance the patient is well-developed and well-nourished with normal grooming and hygiene.   Musculoskeletal:  GAIT looks normal  Neurological: He is  alert and oriented to person, place, and time.  Skin: Skin is warm and dry. No erythema.  Psychiatric: He has a normal mood and affect.  Vitals reviewed.   Ortho Exam  Right knee tenderness over the quadriceps insertion no tenderness on the joint lines I think he has a mild effusion it does not affect his knee range of motion has mild pain with extension especially against resistance there is no instability in the knee neurovascular exam is intact skin is warm dry no erythema around the knee knee does feel a little warm  Left knee pain-free no swelling or tenderness full range Medical decision-making  Imaging: Plain film was done at the hospital my interpretation of that is that he has a normal-appearing knee no significant effusion is noted  Encounter Diagnosis  Name Primary?  . Acute pain of right knee Yes   Unexplained pain right knee appears to be primarily located around the quadriceps tendon  His wife does indicate he had a blood test which indicated he may have had a high uric acid level  Uric acid on February 1 was 7.7  Recommend nsaid Naprosyn 500 mg twice a day do not take the Colcrys right now okay to take the tramadol  Recheck knee in 3 -4 weeks   Dr. Randel Pigg office was called apparently gave  him Colcrys and tramadol I am going to add some Naprosyn And see him in 2 weeks Arther Abbott, MD 08/22/2017 3:56 PM

## 2017-09-03 ENCOUNTER — Ambulatory Visit: Payer: Medicare Other | Admitting: Orthopedic Surgery

## 2017-09-03 ENCOUNTER — Encounter: Payer: Self-pay | Admitting: Orthopedic Surgery

## 2017-09-03 DIAGNOSIS — M25561 Pain in right knee: Secondary | ICD-10-CM

## 2017-09-03 NOTE — Patient Instructions (Signed)
Take medication 1 more week and then you can stop

## 2017-09-03 NOTE — Progress Notes (Signed)
Progress Note   Patient ID: Jerome Chambers, male   DOB: 1929-02-04, 84 y.o.   MRN: [id number]  Chief Complaint  Patient presents with  . Knee Pain    right     84 year old male presents for reevaluation of his right knee was placed on Naprosyn after his last visit when we diagnosed him with tendinitis he says he has just a little bit of soreness left.     ROS Current Meds  Medication Sig  . aspirin 81 MG tablet Take 81 mg by mouth daily.  . cholecalciferol (VITAMIN D) 1000 UNITS tablet Take 1,000 Units by mouth daily.  Marland Kitchen donepezil (ARICEPT) 5 MG tablet Take 5 mg by mouth at bedtime.  Marland Kitchen loratadine (CLARITIN) 10 MG tablet Take 10 mg by mouth daily.  . multivitamin with minerals (CERTA-VITE) LIQD Take 5 mLs by mouth daily.  . naproxen (NAPROSYN) 500 MG tablet Take 1 tablet (500 mg total) by mouth 2 (two) times daily with a meal.  . traMADol (ULTRAM) 50 MG tablet Take 1 tablet (50 mg total) by mouth every 12 (twelve) hours as needed.    No Known Allergies  Height 511 weight 178 blood pressure 148/78 and pulse 92  Physical Exam  Very minimal tenderness at the superior pole patella he has full range of motion and no effusion Medical decision-making  Encounter Diagnosis  Name Primary?  . Acute pain of right knee Yes    Assessment and plan the patient will take the medication which is Naprosyn for 1 more week and then he can stop if he has any return of pain or swelling he can be seen     Arther Abbott, MD 09/03/2017 11:16 AM

## 2017-12-19 ENCOUNTER — Ambulatory Visit: Payer: Medicare Other | Admitting: Orthopedic Surgery

## 2017-12-19 ENCOUNTER — Encounter: Payer: Self-pay | Admitting: Orthopedic Surgery

## 2017-12-19 ENCOUNTER — Ambulatory Visit (INDEPENDENT_AMBULATORY_CARE_PROVIDER_SITE_OTHER): Payer: Medicare Other

## 2017-12-19 VITALS — BP 132/73 | HR 89 | Ht 71.0 in | Wt 183.0 lb

## 2017-12-19 DIAGNOSIS — M25462 Effusion, left knee: Secondary | ICD-10-CM | POA: Diagnosis not present

## 2017-12-19 DIAGNOSIS — G8929 Other chronic pain: Secondary | ICD-10-CM | POA: Diagnosis not present

## 2017-12-19 DIAGNOSIS — M25562 Pain in left knee: Secondary | ICD-10-CM | POA: Diagnosis not present

## 2017-12-19 NOTE — Progress Notes (Signed)
Progress Note   Patient ID: Jerome Chambers, male   DOB: 07-14-1929, 84 y.o.   MRN: [id number] Chief Complaint  Patient presents with  . Knee Pain    left      Medical decision-making  Imaging:  Xrays ordered: y/n ? yes  Encounter Diagnoses  Name Primary?  . Chronic pain of left knee   . Effusion of knee joint, left Yes     PLAN:  Apparently has large joint effusion from osteoarthritis left knee with Baker's cyst  Recommend aspiration injection follow-up in 2 to 3 weeks if no improvement arrange aspiration of Baker's cyst with ultrasound guidance  Procedure note left knee injection verbal consent was obtained to inject left knee joint  Timeout was completed to confirm the site of injection  The medications used were 40 mg of Depo-Medrol and 1% lidocaine 3 cc  Anesthesia was provided by ethyl chloride and the skin was prepped with alcohol.  After cleaning the skin with alcohol a 20-gauge needle was used to inject the left knee joint. There were no complications. A sterile bandage was applied.     FURTHER WORKUP: no  No orders of the defined types were placed in this encounter.     Chief Complaint  Patient presents with  . Knee Pain    left     84 year old male comes in for follow-up although this is a new problem regarding his left knee.  I saw him in February 2019 after viral syndrome in which she had acute onset of right knee pain was treated for presumptive gout.  He now complains of several week history of moderately severe left posterior knee pain primarily when he gets up in the morning not associated with any swelling loss of motion limping.  He says it does not hurt during the day.  He recently found out he has poor kidney function and cannot take NSAIDs so he has been taking some tramadol with good relief    Review of Systems  Genitourinary: Negative for dysuria.  Musculoskeletal: Negative for back pain.  Skin: Negative.   Neurological: Negative for  tingling.   Current Meds  Medication Sig  . aspirin 81 MG tablet Take 81 mg by mouth daily.  . cholecalciferol (VITAMIN D) 1000 UNITS tablet Take 1,000 Units by mouth daily.  Marland Kitchen donepezil (ARICEPT) 5 MG tablet Take 5 mg by mouth at bedtime.  Marland Kitchen loratadine (CLARITIN) 10 MG tablet Take 10 mg by mouth daily.  . multivitamin with minerals (CERTA-VITE) LIQD Take 5 mLs by mouth daily.    Past Medical History:  Diagnosis Date  . Allergic rhinitis, cause unspecified   . Diabetes ([place])   . Diverticulosis   . DJD (degenerative joint disease)   . Glaucoma   . History of Helicobacter pylori infection   . Sleep apnea    NO CPAP MACHINE   . Unspecified essential hypertension      No Known Allergies  BP 132/73   Pulse 89   Ht 5\' 11"  (1.803 m)   Wt 183 lb (83 kg)   BMI 25.52 kg/m    Physical Exam  Constitutional: He is oriented to person, place, and time. He appears well-developed and well-nourished.  Vital signs have been reviewed and are stable. Gen. appearance the patient is well-developed and well-nourished with normal grooming and hygiene.   Musculoskeletal:       Left knee: He exhibits effusion.  Neurological: He is alert and oriented to person, place, and time.  Skin: Skin is warm and dry. No erythema.  Psychiatric: He has a normal mood and affect.  Vitals reviewed.   Right Knee Exam   Muscle Strength  The patient has normal right knee strength.  Tenderness  The patient is experiencing no tenderness.   Range of Motion  Extension: normal  Flexion: normal   Tests  McMurray:  Medial - negative Lateral - negative Varus: negative Valgus: negative Drawer:  Anterior - negative    Posterior - negative  Other  Erythema: absent Scars: absent Sensation: normal Pulse: present Swelling: none   Left Knee Exam   Muscle Strength  The patient has normal left knee strength.  Tenderness  Left knee tenderness location: Medial lateral joint line nontender popliteal fossa  palpable mass consistent with Baker's cyst.  Range of Motion  Left knee extension: Slight loss of extension.  Flexion: normal   Tests  McMurray:  Medial - negative Lateral - negative Varus: negative Valgus: negative Drawer:  Anterior - negative     Posterior - negative  Other  Erythema: absent Scars: absent Sensation: normal Pulse: present Swelling: none Effusion: effusion present  Comments:  Large joint effusion      Arther Abbott, MD  10:30 AM 12/19/2017

## 2017-12-19 NOTE — Patient Instructions (Signed)
2-3 weeks

## 2018-01-02 ENCOUNTER — Ambulatory Visit: Payer: Medicare Other | Admitting: Orthopedic Surgery

## 2018-01-14 ENCOUNTER — Encounter: Payer: Self-pay | Admitting: Orthopedic Surgery

## 2018-01-14 ENCOUNTER — Ambulatory Visit: Payer: Medicare Other | Admitting: Orthopedic Surgery

## 2018-01-14 VITALS — BP 132/81 | HR 68 | Ht 71.0 in | Wt 188.0 lb

## 2018-01-14 DIAGNOSIS — M25462 Effusion, left knee: Secondary | ICD-10-CM

## 2018-01-14 NOTE — Progress Notes (Signed)
Follow-up status post left knee effusion.  Chief Complaint  Patient presents with  . Knee Pain    left knee feels much better today    Patient had a large left knee effusion.  We did an aspiration and injection he comes in with no complaints  He is ambulatory without any assistive devices he has full range of motion and no swelling in his left knee  Encounter Diagnosis  Name Primary?  . Effusion of knee joint, left Yes    Return as needed

## 2018-03-19 ENCOUNTER — Ambulatory Visit: Payer: Medicare Other | Admitting: Podiatry

## 2018-04-03 ENCOUNTER — Encounter: Payer: Self-pay | Admitting: Orthopedic Surgery

## 2018-04-03 ENCOUNTER — Ambulatory Visit: Payer: Medicare Other | Admitting: Orthopedic Surgery

## 2018-04-03 VITALS — BP 118/63 | HR 74 | Ht 71.0 in | Wt 188.0 lb

## 2018-04-03 DIAGNOSIS — M25462 Effusion, left knee: Secondary | ICD-10-CM

## 2018-04-03 NOTE — Patient Instructions (Signed)
Knee Effusion Knee effusion means that you have excess fluid in your knee joint. This can cause pain and swelling in your knee. This may make your knee more difficult to bend and move. That is because there is increased pain and pressure in the joint. If there is fluid in your knee, it often means that something is wrong inside your knee, such as severe arthritis, abnormal inflammation, or an infection. Another common cause of knee effusion is an injury to the knee muscles, ligaments, or cartilage. Follow these instructions at home:  Use crutches as directed by your health care provider.  Wear a knee brace as directed by your health care provider.  Apply ice to the swollen area: ? Put ice in a plastic bag. ? Place a towel between your skin and the bag. ? Leave the ice on for 20 minutes, 2-3 times per day.  Keep your knee raised (elevated) when you are sitting or lying down.  Take medicines only as directed by your health care provider.  Do any rehabilitation or strengthening exercises as directed by your health care provider.  Rest your knee as directed by your health care provider. You may start doing your normal activities again when your health care provider approves.  Keep all follow-up visits as directed by your health care provider. This is important. Contact a health care provider if:  You have ongoing (persistent) pain in your knee. Get help right away if:  You have increased swelling or redness of your knee.  You have severe pain in your knee.  You have a fever. This information is not intended to replace advice given to you by your health care provider. Make sure you discuss any questions you have with your health care provider. Document Released: 09/23/2003 Document Revised: 12/09/2015 Document Reviewed: 02/16/2014 Elsevier Interactive Patient Education  2018 [employer]  

## 2018-04-03 NOTE — Progress Notes (Signed)
Chief Complaint  Patient presents with  . Knee Pain    Left knee pain and swelling   84 year old male history of possible gout related knee effusion with arthritis.  Can no longer take naproxen secondary to kidney insufficiency  Comes in with fatigue in the left knee after walking without significant pain just some discomfort unrelieved by knee sleeve associated with some swelling and mild loss of motion for the last 3 to 4 weeks  Review of Systems  Skin: Negative.   Neurological: Negative for tingling, focal weakness and weakness.   Past Medical History:  Diagnosis Date  . Allergic rhinitis, cause unspecified   . Diabetes ([place])   . Diverticulosis   . DJD (degenerative joint disease)   . Glaucoma   . History of Helicobacter pylori infection   . Sleep apnea    NO CPAP MACHINE   . Unspecified essential hypertension       Current Outpatient Medications:  .  aspirin 81 MG tablet, Take 81 mg by mouth daily., Disp: , Rfl:  .  cholecalciferol (VITAMIN D) 1000 UNITS tablet, Take 1,000 Units by mouth daily., Disp: , Rfl:  .  donepezil (ARICEPT) 5 MG tablet, Take 5 mg by mouth at bedtime., Disp: , Rfl:  .  loratadine (CLARITIN) 10 MG tablet, Take 10 mg by mouth daily., Disp: , Rfl:  .  multivitamin with minerals (CERTA-VITE) LIQD, Take 5 mLs by mouth daily., Disp: , Rfl:    BP 118/63   Pulse 74   Ht 5\' 11"  (1.803 m)   Wt 188 lb (85.3 kg)   BMI 26.22 kg/m  Physical Exam  Constitutional: He is oriented to person, place, and time. He appears well-developed and well-nourished.  Vital signs have been reviewed and are stable. Gen. appearance the patient is well-developed and well-nourished with normal grooming and hygiene.   Cardiovascular:  Pulses:      Dorsalis pedis pulses are 2+ on the right side, and 2+ on the left side.       Posterior tibial pulses are 2+ on the right side, and 2+ on the left side.  Musculoskeletal:       Right knee: He exhibits normal range of motion, no  swelling, no effusion, no ecchymosis, no deformity, no laceration, no LCL laxity and no MCL laxity. No tenderness found.       Left knee: He exhibits effusion. He exhibits normal range of motion, no swelling, no ecchymosis, no deformity, no erythema, normal alignment, no LCL laxity, no bony tenderness, normal meniscus and no MCL laxity. No tenderness found.  Neurological: He is alert and oriented to person, place, and time. He has normal strength. No sensory deficit. Gait normal.  Skin: Skin is warm and dry. No erythema.  Psychiatric: He has a normal mood and affect.  Vitals reviewed.  Encounter Diagnosis  Name Primary?  . Effusion of knee joint, left Yes    Procedure note injection and aspiration left knee joint  Verbal consent was obtained to aspirate and inject the left knee joint   Timeout was completed to confirm the site of aspiration and injection  An 18-gauge needle was used to aspirate the left knee joint from a suprapatellar lateral approach.  The medications used were 40 mg of Depo-Medrol and 1% lidocaine 3 cc  Anesthesia was provided by ethyl chloride and the skin was prepped with alcohol.  After cleaning the skin with alcohol an 18-gauge needle was used to aspirate the right knee joint.  We  obtained 45 cc of fluid  We followed this by injection of 40 mg of Depo-Medrol and 3 cc 1% lidocaine.  There were no complications. A sterile bandage was applied.

## 2018-04-10 ENCOUNTER — Ambulatory Visit: Payer: Medicare Other | Admitting: Orthopedic Surgery

## 2019-03-17 ENCOUNTER — Telehealth: Payer: Self-pay | Admitting: Orthopedic Surgery

## 2019-03-17 NOTE — Telephone Encounter (Signed)
Patient's spouse and contact on file, Estill Bamberg, called to inquire about appointment for foot - had left message Friday, 03/14/19 after hours. Upon call back, states feeling better and will call back if decides to schedule appointment.

## 2019-04-09 ENCOUNTER — Ambulatory Visit: Payer: Medicare Other | Admitting: Orthopedic Surgery

## 2019-08-26 ENCOUNTER — Ambulatory Visit: Payer: Medicare Other | Attending: Internal Medicine

## 2019-08-28 ENCOUNTER — Ambulatory Visit: Payer: Medicare Other

## 2019-12-30 ENCOUNTER — Ambulatory Visit: Payer: Medicare Other | Admitting: Orthopaedic Surgery

## 2020-02-02 ENCOUNTER — Other Ambulatory Visit (HOSPITAL_COMMUNITY): Payer: Self-pay | Admitting: Internal Medicine

## 2020-02-02 ENCOUNTER — Other Ambulatory Visit: Payer: Self-pay | Admitting: Internal Medicine

## 2020-02-03 ENCOUNTER — Other Ambulatory Visit: Payer: Self-pay | Admitting: Internal Medicine

## 2020-02-03 ENCOUNTER — Other Ambulatory Visit (HOSPITAL_COMMUNITY): Payer: Self-pay | Admitting: Internal Medicine

## 2020-02-03 ENCOUNTER — Ambulatory Visit (HOSPITAL_COMMUNITY)
Admission: RE | Admit: 2020-02-03 | Discharge: 2020-02-03 | Disposition: A | Discharge status: Home / Self Care | Payer: Medicare Other | Source: Ambulatory Visit | Attending: Internal Medicine | Admitting: Internal Medicine

## 2020-02-03 ENCOUNTER — Other Ambulatory Visit: Payer: Self-pay

## 2020-02-03 DIAGNOSIS — R1084 Generalized abdominal pain: Secondary | ICD-10-CM

## 2020-02-03 DIAGNOSIS — N179 Acute kidney failure, unspecified: Secondary | ICD-10-CM | POA: Diagnosis not present

## 2020-02-06 ENCOUNTER — Emergency Department (HOSPITAL_COMMUNITY): Payer: Medicare Other

## 2020-02-06 ENCOUNTER — Encounter (HOSPITAL_COMMUNITY): Payer: Self-pay | Admitting: Emergency Medicine

## 2020-02-06 ENCOUNTER — Inpatient Hospital Stay (HOSPITAL_COMMUNITY)
Admission: EM | Admit: 2020-02-06 | Discharge: 2020-02-13 | DRG: 683 | Disposition: A | Discharge status: Skilled Nursing Facility | Payer: Medicare Other | Attending: Internal Medicine | Admitting: Internal Medicine

## 2020-02-06 ENCOUNTER — Other Ambulatory Visit: Payer: Self-pay

## 2020-02-06 ENCOUNTER — Inpatient Hospital Stay (HOSPITAL_COMMUNITY): Payer: Medicare Other

## 2020-02-06 DIAGNOSIS — N179 Acute kidney failure, unspecified: Secondary | ICD-10-CM | POA: Diagnosis present

## 2020-02-06 DIAGNOSIS — Z7984 Long term (current) use of oral hypoglycemic drugs: Secondary | ICD-10-CM | POA: Diagnosis not present

## 2020-02-06 DIAGNOSIS — D631 Anemia in chronic kidney disease: Secondary | ICD-10-CM | POA: Diagnosis present

## 2020-02-06 DIAGNOSIS — M1990 Unspecified osteoarthritis, unspecified site: Secondary | ICD-10-CM | POA: Diagnosis present

## 2020-02-06 DIAGNOSIS — K219 Gastro-esophageal reflux disease without esophagitis: Secondary | ICD-10-CM | POA: Diagnosis not present

## 2020-02-06 DIAGNOSIS — R2681 Unsteadiness on feet: Secondary | ICD-10-CM | POA: Diagnosis present

## 2020-02-06 DIAGNOSIS — K8020 Calculus of gallbladder without cholecystitis without obstruction: Secondary | ICD-10-CM

## 2020-02-06 DIAGNOSIS — C227 Other specified carcinomas of liver: Secondary | ICD-10-CM | POA: Diagnosis present

## 2020-02-06 DIAGNOSIS — D696 Thrombocytopenia, unspecified: Secondary | ICD-10-CM | POA: Diagnosis present

## 2020-02-06 DIAGNOSIS — I10 Essential (primary) hypertension: Secondary | ICD-10-CM | POA: Diagnosis present

## 2020-02-06 DIAGNOSIS — N1832 Chronic kidney disease, stage 3b: Secondary | ICD-10-CM | POA: Diagnosis present

## 2020-02-06 DIAGNOSIS — R160 Hepatomegaly, not elsewhere classified: Secondary | ICD-10-CM | POA: Diagnosis present

## 2020-02-06 DIAGNOSIS — G4733 Obstructive sleep apnea (adult) (pediatric): Secondary | ICD-10-CM | POA: Diagnosis present

## 2020-02-06 DIAGNOSIS — N3001 Acute cystitis with hematuria: Secondary | ICD-10-CM | POA: Diagnosis present

## 2020-02-06 DIAGNOSIS — Z20822 Contact with and (suspected) exposure to covid-19: Secondary | ICD-10-CM | POA: Diagnosis present

## 2020-02-06 DIAGNOSIS — H409 Unspecified glaucoma: Secondary | ICD-10-CM | POA: Diagnosis present

## 2020-02-06 DIAGNOSIS — E1122 Type 2 diabetes mellitus with diabetic chronic kidney disease: Secondary | ICD-10-CM | POA: Diagnosis present

## 2020-02-06 DIAGNOSIS — I129 Hypertensive chronic kidney disease with stage 1 through stage 4 chronic kidney disease, or unspecified chronic kidney disease: Secondary | ICD-10-CM | POA: Diagnosis present

## 2020-02-06 DIAGNOSIS — T17908A Unspecified foreign body in respiratory tract, part unspecified causing other injury, initial encounter: Secondary | ICD-10-CM

## 2020-02-06 DIAGNOSIS — M79601 Pain in right arm: Secondary | ICD-10-CM | POA: Diagnosis present

## 2020-02-06 DIAGNOSIS — R5381 Other malaise: Secondary | ICD-10-CM | POA: Diagnosis present

## 2020-02-06 DIAGNOSIS — Z9181 History of falling: Secondary | ICD-10-CM | POA: Diagnosis not present

## 2020-02-06 DIAGNOSIS — F0390 Unspecified dementia without behavioral disturbance: Secondary | ICD-10-CM | POA: Diagnosis present

## 2020-02-06 DIAGNOSIS — R1011 Right upper quadrant pain: Secondary | ICD-10-CM | POA: Diagnosis not present

## 2020-02-06 DIAGNOSIS — J309 Allergic rhinitis, unspecified: Secondary | ICD-10-CM | POA: Diagnosis present

## 2020-02-06 DIAGNOSIS — E119 Type 2 diabetes mellitus without complications: Secondary | ICD-10-CM

## 2020-02-06 DIAGNOSIS — K8012 Calculus of gallbladder with acute and chronic cholecystitis without obstruction: Secondary | ICD-10-CM

## 2020-02-06 DIAGNOSIS — E860 Dehydration: Secondary | ICD-10-CM | POA: Diagnosis present

## 2020-02-06 DIAGNOSIS — N3090 Cystitis, unspecified without hematuria: Secondary | ICD-10-CM | POA: Diagnosis not present

## 2020-02-06 DIAGNOSIS — Z79899 Other long term (current) drug therapy: Secondary | ICD-10-CM

## 2020-02-06 DIAGNOSIS — K5900 Constipation, unspecified: Secondary | ICD-10-CM | POA: Diagnosis present

## 2020-02-06 DIAGNOSIS — D72829 Elevated white blood cell count, unspecified: Secondary | ICD-10-CM

## 2020-02-06 DIAGNOSIS — R1110 Vomiting, unspecified: Secondary | ICD-10-CM

## 2020-02-06 DIAGNOSIS — R531 Weakness: Secondary | ICD-10-CM

## 2020-02-06 DIAGNOSIS — W19XXXA Unspecified fall, initial encounter: Secondary | ICD-10-CM | POA: Diagnosis present

## 2020-02-06 DIAGNOSIS — N390 Urinary tract infection, site not specified: Secondary | ICD-10-CM | POA: Diagnosis present

## 2020-02-06 DIAGNOSIS — R1084 Generalized abdominal pain: Secondary | ICD-10-CM

## 2020-02-06 DIAGNOSIS — R262 Difficulty in walking, not elsewhere classified: Secondary | ICD-10-CM | POA: Diagnosis present

## 2020-02-06 HISTORY — DX: Unspecified dementia, unspecified severity, without behavioral disturbance, psychotic disturbance, mood disturbance, and anxiety: F03.90

## 2020-02-06 LAB — CBC WITH DIFFERENTIAL/PLATELET
Abs Immature Granulocytes: 0.02 10*3/uL (ref 0.00–0.07)
Basophils Absolute: 0 10*3/uL (ref 0.0–0.1)
Basophils Relative: 0 %
Eosinophils Absolute: 0 10*3/uL (ref 0.0–0.5)
Eosinophils Relative: 0 %
HCT: 33.9 % — ABNORMAL LOW (ref 39.0–52.0)
Hemoglobin: 11.1 g/dL — ABNORMAL LOW (ref 13.0–17.0)
Immature Granulocytes: 0 %
Lymphocytes Relative: 7 %
Lymphs Abs: 0.5 10*3/uL — ABNORMAL LOW (ref 0.7–4.0)
MCH: 33.2 pg (ref 26.0–34.0)
MCHC: 32.7 g/dL (ref 30.0–36.0)
MCV: 101.5 fL — ABNORMAL HIGH (ref 80.0–100.0)
Monocytes Absolute: 0.5 10*3/uL (ref 0.1–1.0)
Monocytes Relative: 6 %
Neutro Abs: 6.7 10*3/uL (ref 1.7–7.7)
Neutrophils Relative %: 87 %
Platelets: 120 10*3/uL — ABNORMAL LOW (ref 150–400)
RBC: 3.34 MIL/uL — ABNORMAL LOW (ref 4.22–5.81)
RDW: 13.4 % (ref 11.5–15.5)
WBC: 7.8 10*3/uL (ref 4.0–10.5)
nRBC: 0 % (ref 0.0–0.2)

## 2020-02-06 LAB — HEMOGLOBIN A1C
Hgb A1c MFr Bld: 5.5 % (ref 4.8–5.6)
Mean Plasma Glucose: 111.15 mg/dL

## 2020-02-06 LAB — COMPREHENSIVE METABOLIC PANEL
ALT: 40 U/L (ref 0–44)
AST: 79 U/L — ABNORMAL HIGH (ref 15–41)
Albumin: 3.7 g/dL (ref 3.5–5.0)
Alkaline Phosphatase: 448 U/L — ABNORMAL HIGH (ref 38–126)
Anion gap: 15 (ref 5–15)
BUN: 43 mg/dL — ABNORMAL HIGH (ref 8–23)
CO2: 21 mmol/L — ABNORMAL LOW (ref 22–32)
Calcium: 9.5 mg/dL (ref 8.9–10.3)
Chloride: 100 mmol/L (ref 98–111)
Creatinine, Ser: 2.53 mg/dL — ABNORMAL HIGH (ref 0.61–1.24)
GFR calc Af Amer: 25 mL/min — ABNORMAL LOW (ref >60)
GFR calc non Af Amer: 22 mL/min — ABNORMAL LOW (ref >60)
Glucose, Bld: 139 mg/dL — ABNORMAL HIGH (ref 70–99)
Potassium: 4.6 mmol/L (ref 3.5–5.1)
Sodium: 136 mmol/L (ref 135–145)
Total Bilirubin: 1.2 mg/dL (ref 0.3–1.2)
Total Protein: 8 g/dL (ref 6.5–8.1)

## 2020-02-06 LAB — TSH: TSH: 2.209 u[IU]/mL (ref 0.350–4.500)

## 2020-02-06 LAB — URINALYSIS, ROUTINE W REFLEX MICROSCOPIC
Bacteria, UA: NONE SEEN
Bilirubin Urine: NEGATIVE
Glucose, UA: NEGATIVE mg/dL
Ketones, ur: 5 mg/dL — AB
Leukocytes,Ua: NEGATIVE
Nitrite: NEGATIVE
Protein, ur: >=300 mg/dL — AB
RBC / HPF: >50 RBC/hpf — ABNORMAL HIGH (ref 0–5)
Specific Gravity, Urine: 1.013 (ref 1.005–1.030)
WBC, UA: >50 WBC/hpf — ABNORMAL HIGH (ref 0–5)
pH: 5 (ref 5.0–8.0)

## 2020-02-06 LAB — CBG MONITORING, ED: Glucose-Capillary: 130 mg/dL — ABNORMAL HIGH (ref 70–99)

## 2020-02-06 LAB — GLUCOSE, CAPILLARY: Glucose-Capillary: 97 mg/dL (ref 70–99)

## 2020-02-06 LAB — SARS CORONAVIRUS 2 BY RT PCR (HOSPITAL ORDER, PERFORMED IN ~~LOC~~ HOSPITAL LAB): SARS Coronavirus 2: NEGATIVE

## 2020-02-06 MED ORDER — SODIUM CHLORIDE 0.9 % IV BOLUS
500.0000 mL | Freq: Once | INTRAVENOUS | Status: AC
  Administered 2020-02-06: 500 mL via INTRAVENOUS

## 2020-02-06 MED ORDER — ACETAMINOPHEN 650 MG RE SUPP: 650.0000 mg | Freq: Four times a day (QID) | RECTAL | Status: DC | PRN

## 2020-02-06 MED ORDER — ADULT MULTIVITAMIN W/MINERALS CH
1.0000 | ORAL_TABLET | Freq: Every day | ORAL | Status: DC
  Administered 2020-02-06 – 2020-02-12 (×7): 1 via ORAL
  Filled 2020-02-06 (×7): qty 1

## 2020-02-06 MED ORDER — ACETAMINOPHEN 325 MG PO TABS: 650.0000 mg | ORAL_TABLET | Freq: Four times a day (QID) | ORAL | Status: DC | PRN

## 2020-02-06 MED ORDER — LATANOPROST 0.005 % OP SOLN
1.0000 [drp] | OPHTHALMIC | Status: DC
  Administered 2020-02-07 – 2020-02-12 (×6): 1 [drp] via OPHTHALMIC
  Filled 2020-02-06: qty 2.5

## 2020-02-06 MED ORDER — SODIUM CHLORIDE 0.9 % IV SOLN: INTRAVENOUS | Status: DC

## 2020-02-06 MED ORDER — FERROUS GLUCONATE 324 (38 FE) MG PO TABS
324.0000 mg | ORAL_TABLET | Freq: Every day | ORAL | Status: DC
  Administered 2020-02-07 – 2020-02-09 (×3): 324 mg via ORAL
  Filled 2020-02-06 (×5): qty 1

## 2020-02-06 MED ORDER — ONDANSETRON HCL 4 MG PO TABS: 4.0000 mg | ORAL_TABLET | Freq: Four times a day (QID) | ORAL | Status: DC | PRN

## 2020-02-06 MED ORDER — INSULIN ASPART 100 UNIT/ML ~~LOC~~ SOLN
0.0000 [IU] | Freq: Three times a day (TID) | SUBCUTANEOUS | Status: DC
  Administered 2020-02-06 – 2020-02-08 (×4): 1 [IU] via SUBCUTANEOUS
  Administered 2020-02-09: 2 [IU] via SUBCUTANEOUS
  Administered 2020-02-09 – 2020-02-10 (×3): 1 [IU] via SUBCUTANEOUS
  Administered 2020-02-10: 2 [IU] via SUBCUTANEOUS
  Administered 2020-02-11 – 2020-02-13 (×5): 1 [IU] via SUBCUTANEOUS
  Filled 2020-02-06: qty 1

## 2020-02-06 MED ORDER — SODIUM CHLORIDE 0.9 % IV SOLN
1.0000 g | INTRAVENOUS | Status: AC
  Administered 2020-02-07 – 2020-02-08 (×2): 1 g via INTRAVENOUS
  Filled 2020-02-06 (×2): qty 10

## 2020-02-06 MED ORDER — ASPIRIN EC 81 MG PO TBEC
81.0000 mg | DELAYED_RELEASE_TABLET | Freq: Every day | ORAL | Status: DC
  Administered 2020-02-06 – 2020-02-12 (×7): 81 mg via ORAL
  Filled 2020-02-06 (×7): qty 1

## 2020-02-06 MED ORDER — ENOXAPARIN SODIUM 30 MG/0.3ML ~~LOC~~ SOLN
30.0000 mg | SUBCUTANEOUS | Status: DC
  Administered 2020-02-06 – 2020-02-11 (×6): 30 mg via SUBCUTANEOUS
  Filled 2020-02-06 (×6): qty 0.3

## 2020-02-06 MED ORDER — BISACODYL 5 MG PO TBEC: 5.0000 mg | DELAYED_RELEASE_TABLET | Freq: Every day | ORAL | Status: DC | PRN

## 2020-02-06 MED ORDER — TRAZODONE HCL 50 MG PO TABS
25.0000 mg | ORAL_TABLET | Freq: Every evening | ORAL | Status: DC | PRN
  Filled 2020-02-06: qty 1

## 2020-02-06 MED ORDER — SODIUM CHLORIDE 0.9 % IV SOLN
1.0000 g | Freq: Once | INTRAVENOUS | Status: AC
  Administered 2020-02-06: 1 g via INTRAVENOUS
  Filled 2020-02-06: qty 10

## 2020-02-06 MED ORDER — ONDANSETRON HCL 4 MG/2ML IJ SOLN
4.0000 mg | Freq: Four times a day (QID) | INTRAMUSCULAR | Status: DC | PRN
  Administered 2020-02-07 – 2020-02-09 (×2): 4 mg via INTRAVENOUS
  Filled 2020-02-06 (×2): qty 2

## 2020-02-06 MED ORDER — AMLODIPINE BESYLATE 5 MG PO TABS
2.5000 mg | ORAL_TABLET | Freq: Every day | ORAL | Status: DC
  Administered 2020-02-06: 2.5 mg via ORAL
  Filled 2020-02-06 (×2): qty 1

## 2020-02-06 MED ORDER — VITAMIN D 25 MCG (1000 UNIT) PO TABS
1000.0000 [IU] | ORAL_TABLET | Freq: Every morning | ORAL | Status: DC
  Administered 2020-02-07 – 2020-02-12 (×6): 1000 [IU] via ORAL
  Filled 2020-02-06 (×6): qty 1

## 2020-02-06 NOTE — ED Triage Notes (Signed)
Pt arrives via EMS from home. Pts wife called EMS due to pt being weak for 2 days. This morning the weakness is worse. Wife also stated that pt has had blood in his urine.

## 2020-02-06 NOTE — Plan of Care (Signed)
°  Problem: Acute Rehab PT Goals(only PT should resolve) Goal: Pt Will Go Supine/Side To Sit Outcome: Progressing Flowsheets (Taken 02/06/2020 1616) Pt will go Supine/Side to Sit:  with min guard assist  with minimal assist Goal: Patient Will Transfer Sit To/From Stand Outcome: Progressing Flowsheets (Taken 02/06/2020 1616) Patient will transfer sit to/from stand:  with minimal assist  with moderate assist Goal: Pt Will Transfer Bed To Chair/Chair To Bed Outcome: Progressing Flowsheets (Taken 02/06/2020 1616) Pt will Transfer Bed to Chair/Chair to Bed:  with min assist  with mod assist Goal: Pt Will Ambulate Outcome: Progressing Flowsheets (Taken 02/06/2020 1616) Pt will Ambulate:  25 feet  with minimal assist  with moderate assist  with rolling walker   4:17 PM, 02/06/20 Lonell Grandchild, MPT Physical Therapist with [facility] [phone] office [phone] mobile phone

## 2020-02-06 NOTE — ED Notes (Signed)
Spoke with pts wife, Estill Bamberg, [phone].  Informed of pts being admitted to hospital.

## 2020-02-06 NOTE — H&P (Signed)
History and Physical  [facility]  Jerome Chambers XNT:[id number] DOB: 06-Sep-1928 DOA: 02/06/2020  PCP: Rosita Fire, MD  Patient coming from: Home by EMS  I have personally briefly reviewed patient's old medical records in [place]  Chief Complaint: weakness   HPI: Jerome Chambers is a 86 y.o. male with medical history significant of dementia, stage 3 CKD, type 2 diabetes mellitus, OSA not on CPAP, HTN, glaucoma who has been having symptoms of weakness and malaise over the past week.  He has been working with his primary care physician and they have been evaluating him for gallbladder disease.  He had an episode yesterday where he stood up from a seated position and passed out.  He fell on his right arm.  He was unable to stand up after that.  His wife had to get assistance to get him up off the floor.  He had complained of some right arm pain other wise he had no complaints.  He is still having difficulty ambulating.  His wife reports that he has been fully vaccinated for COVID-19.  ED Course: Temperature 98.3 pulse 81 respirations 18 blood pressure 140/93 pulse ox 99% on room air, glucose 139, BUN 43, creatinine 2.53, alk phos 448, GFR 25, hemoglobin 11.1, HCT 33.1, MCV 101.5, platelet count 120.  Urinalysis with greater than 50 WBC per high-power field, RBC per high-power field greater than 50, protein 300, chest x-ray no acute findings.  The patient was given some IV fluid hydration started on IV ceftriaxone and admission was requested.  Review of Systems: As per HPI otherwise 10 point review of systems negative.    Past Medical History:  Diagnosis Date  . Allergic rhinitis, cause unspecified   . Dementia ([place])   . Diabetes ([place])   . Diverticulosis   . DJD (degenerative joint disease)   . Glaucoma   . History of Helicobacter pylori infection   . Sleep apnea    NO CPAP MACHINE   . Unspecified essential hypertension     History reviewed. No pertinent surgical history.    reports that he has never smoked. He has never used smokeless tobacco. He reports that he does not drink alcohol and does not use drugs.  No Known Allergies  Family History  Problem Relation Age of Onset  . Colon cancer Neg Hx      Prior to Admission medications   Medication Sig Start Date End Date Taking? Authorizing Provider  amLODipine (NORVASC) 2.5 MG tablet Take 2.5 mg by mouth daily.   Yes [provider]  aspirin 81 MG tablet Take 81 mg by mouth daily.   Yes [provider]  Cholecalciferol (VITAMIN D3) 25 MCG (1000 UT) CAPS Take 1 capsule by mouth in the morning.   Yes [provider]  econazole nitrate 1 % cream Apply 1 application topically daily as needed (toe).   Yes [provider]  ferrous gluconate (FERGON) 324 MG tablet Take 324 mg by mouth daily with breakfast.   Yes [provider]  glimepiride (AMARYL) 2 MG tablet Take 2 mg by mouth daily with breakfast.   Yes [provider]  latanoprost (XALATAN) 0.005 % ophthalmic solution Place 1 drop into both eyes every morning.   Yes [provider]  Multiple Vitamin (MULTIVITAMIN WITH MINERALS) TABS tablet Take 1 tablet by mouth daily.   Yes [provider]    Physical Exam: Vitals:   02/06/20 9675 02/06/20 9163 02/06/20 0932 02/06/20 1148  BP: (!) 150/91 (!) 139/79 (!) 142/93 (!) 170/108  Pulse: 86 82 80 83  Resp: (!) 26 (!) 26 (!) 25 20  Temp:      TempSrc:      SpO2: 98% 98% 100% 100%  Weight:      Height:       Constitutional: elderly male with dementia, NAD, calm, comfortable Eyes: PERRL, lids and conjunctivae normal ENMT: Mucous membranes are dry. Posterior pharynx clear of any exudate or lesions.  Neck: normal, supple, no masses, no thyromegaly Respiratory: clear to auscultation bilaterally, no wheezing, no crackles. Normal respiratory effort. No accessory muscle use.  Cardiovascular: Regular rate and rhythm, no murmurs / rubs / gallops. No  extremity edema. 2+ pedal pulses. No carotid bruits.  Abdomen: no tenderness, no masses palpated. No hepatosplenomegaly. Bowel sounds positive.  Musculoskeletal: no clubbing / cyanosis. No joint deformity upper and lower extremities. Good ROM, no contractures. Normal muscle tone.  Skin: no rashes, lesions, ulcers. No induration Neurologic: CN 2-12 grossly intact. Sensation intact, DTR normal. Strength 5/5 in all 4.  Psychiatric: dementia. Alert.  Normal mood.   Labs on Admission: I have personally reviewed following labs and imaging studies  CBC: Recent Labs  Lab 02/06/20 0811  WBC 7.8  NEUTROABS 6.7  HGB 11.1*  HCT 33.9*  MCV 101.5*  PLT 025*   Basic Metabolic Panel: Recent Labs  Lab 02/06/20 0811  NA 136  K 4.6  CL 100  CO2 21*  GLUCOSE 139*  BUN 43*  CREATININE 2.53*  CALCIUM 9.5   GFR: Estimated Creatinine Clearance: 21.1 mL/min (A) (by C-G formula based on SCr of 2.53 mg/dL (H)). Liver Function Tests: Recent Labs  Lab 02/06/20 0811  AST 79*  ALT 40  ALKPHOS 448*  BILITOT 1.2  PROT 8.0  ALBUMIN 3.7   No results for input(s): LIPASE, AMYLASE in the last 168 hours. No results for input(s): AMMONIA in the last 168 hours. Coagulation Profile: No results for input(s): INR, PROTIME in the last 168 hours. Cardiac Enzymes: No results for input(s): CKTOTAL, CKMB, CKMBINDEX, TROPONINI in the last 168 hours. BNP (last 3 results) No results for input(s): PROBNP in the last 8760 hours. HbA1C: No results for input(s): HGBA1C in the last 72 hours. CBG: No results for input(s): GLUCAP in the last 168 hours. Lipid Profile: No results for input(s): CHOL, HDL, LDLCALC, TRIG, CHOLHDL, LDLDIRECT in the last 72 hours. Thyroid Function Tests: No results for input(s): TSH, T4TOTAL, FREET4, T3FREE, THYROIDAB in the last 72 hours. Anemia Panel: No results for input(s): VITAMINB12, FOLATE, FERRITIN, TIBC, IRON, RETICCTPCT in the last 72 hours. Urine analysis:    Component  Value Date/Time   COLORURINE AMBER (A) 02/06/2020 0810   APPEARANCEUR HAZY (A) 02/06/2020 0810   LABSPEC 1.013 02/06/2020 0810   PHURINE 5.0 02/06/2020 0810   GLUCOSEU NEGATIVE 02/06/2020 0810   HGBUR LARGE (A) 02/06/2020 0810   BILIRUBINUR NEGATIVE 02/06/2020 0810   KETONESUR 5 (A) 02/06/2020 0810   PROTEINUR >=300 (A) 02/06/2020 0810   NITRITE NEGATIVE 02/06/2020 0810   LEUKOCYTESUR NEGATIVE 02/06/2020 0810    Radiological Exams on Admission: DG Chest Special View  Result Date: 02/06/2020 CLINICAL DATA:  Nodular densities in the lung bases. EXAM: CHEST SPECIAL VIEW COMPARISON:  Chest x-ray 02/06/2020 FINDINGS: Trachea midline. Cardiomediastinal contours and hilar structures are normal. Lungs are clear. Nipple markers have been placed over nodular densities at the lung bases compatible with nipple shadows. Visualized skeletal structures on limited assessment without acute process.  IMPRESSION: 1. No acute cardiopulmonary disease. 2. Nipple shadows at the lung bases. Electronically Signed   By: Zetta Bills M.D.   On: 02/06/2020 08:36   DG Chest Port 1 View  Result Date: 02/06/2020 CLINICAL DATA:  Weakness EXAM: PORTABLE CHEST 1 VIEW COMPARISON:  10/12/2006 FINDINGS: The heart size and mediastinal contours are within normal limits. Rounded 8 mm nodular densities project over the peripheral aspects of the bilateral lung bases, favored to represent nipple shadows. The lungs are otherwise clear. No focal airspace consolidation, pleural effusion, or pneumothorax. The visualized skeletal structures are unremarkable. IMPRESSION: 1. No active cardiopulmonary disease. 2. Probable nipple shadows at the bilateral lung bases. Repeat radiograph with nipple markers could be performed to confirm. Electronically Signed   By: Davina Poke D.O.   On: 02/06/2020 08:00   Assessment/Plan Principal Problem:   AKI (acute kidney injury) ([place]) Active Problems:   Unspecified glaucoma   Essential  hypertension   Allergic rhinitis   Osteoarthritis   Senile dementia (HCC)   Type 2 diabetes mellitus without complication (HCC)   Acute renal failure superimposed on stage 3 chronic kidney disease (HCC)   UTI (urinary tract infection)   Cystitis   Thrombocytopenia (HCC)   Anemia in chronic kidney disease (CKD)   Gait instability   At risk for falling   1. AKI on stage 3 CKD - suspect prerenal as he is clinically dehydrated and wife confirms poor oral intake over last 2 days.  Treating with gentle IV fluids and recheck BMP in AM.  Hold nephrotoxic agents and renally dose meds as needed.  2. Cystitis - Pt presents with urinary findings of acute cystitis.  Continue ceftriaxone IV and follow urine culture results.  3. Thrombocytopenia - follow CBC.  4. Type 2 diabetes mellitus - SSI coverage and CBG testing ordered.  5. Dementia - delirium precautions, family planning to stay with him as much as possible.  6. Gait instability / generalized weakness - PT evaluation requested.  7. Right arm pain from fall - check xray to rule out fracture.  8. HTN - resume home meds and follow.   DVT prophylaxis: lovenox   Code Status: full   Family Communication:  Wife telephone update   Disposition Plan: home, wife would like for him to return home   Consults called: PT  Admission status: INP   Arionne Iams MD [facility] How to contact the [facility] Attending or Consulting provider [place] or covering provider during after hours [place], for this patient?  1. Check the care team in [facility] and look for a) attending/consulting [facility] provider listed and b) the [facility] team listed 2. Log into [email or web address] and use [place]'s universal password to access. If you do not have the password, please contact the hospital operator. 3. Locate the [facility] provider you are looking for under [facility] and page to a number that you can be directly reached. 4. If you still have difficulty reaching the provider,  please page the [facility] (Director on Call) for the Hospitalists listed on amion for assistance.   If 7PM-7AM, please contact night-coverage [email or web address] Password [facility]  02/06/2020, 12:22 PM

## 2020-02-06 NOTE — ED Notes (Signed)
--  WIFE Jerome Chambers --[name]

## 2020-02-06 NOTE — NC FL2 (Signed)
[place] LEVEL OF CARE SCREENING TOOL     IDENTIFICATION  Patient Name: Jerome Chambers Birthdate: March 19, 1929 Sex: male Admission Date (Current Location): 02/06/2020  [facility] and [place] Number:  [employer] and Address:  [facility] [address], [place]      Provider Number: [id number]  Attending Physician Name and Address:  Murlean Iba, MD  Relative Name and Phone Number:  Hanzel Pizzo (wife) Ph: [phone]    Current Level of Care: Hospital Recommended Level of Care: [place] Prior Approval Number:    Date Approved/Denied:   PASRR Number: [id number]  Discharge Plan: SNF    Current Diagnoses: Patient Active Problem List   Diagnosis Date Noted  . AKI (acute kidney injury) ([place]) 02/06/2020  . UTI (urinary tract infection)   . Cystitis   . Thrombocytopenia ([place])   . Anemia in chronic kidney disease (CKD)   . Gait instability   . At risk for falling   . Acute renal failure superimposed on stage 3 chronic kidney disease ([place]) 08/17/2017  . Acute kidney failure ([place]) 08/16/2017  . Senile dementia ([place]) 08/16/2017  . Type 2 diabetes mellitus without complication ([place]) 89/16/9450  . Special screening for malignant neoplasms, colon 07/03/2012  . ACHILLES BURSITIS OR TENDINITIS 02/07/2010  . OTHER SYMPTOMS INVOLVING DIGESTIVE SYSTEM OTHER 01/21/2009  . DIVERTICULOSIS OF COLON 11/16/2008  . SLEEP APNEA 11/16/2008  . HELICOBACTER PYLORI GASTRITIS 11/12/2008  . DM 11/12/2008  . Unspecified glaucoma 11/12/2008  . Essential hypertension 11/12/2008  . Allergic rhinitis 11/12/2008  . Osteoarthritis 11/12/2008  . LOW BACK PAIN SYNDROME 11/12/2008    Orientation RESPIRATION BLADDER Height & Weight     Self  Normal Continent Weight: 189 lb 9.5 oz (86 kg) Height:  5\' 11"  (180.3 cm)  BEHAVIORAL SYMPTOMS/MOOD NEUROLOGICAL BOWEL NUTRITION STATUS      Continent Diet (Dysphagia diet)  AMBULATORY STATUS  COMMUNICATION OF NEEDS Skin   Extensive Assist Verbally Normal                       Personal Care Assistance Level of Assistance  Bathing, Feeding, Dressing Bathing Assistance: Limited assistance Feeding assistance: Independent Dressing Assistance: Limited assistance     Functional Limitations Info  Sight, Hearing, Speech Sight Info: Impaired (Reading glasses) Hearing Info: Impaired (Limited hearing due military service) Speech Info: Adequate    SPECIAL CARE FACTORS FREQUENCY  PT (By licensed PT)     PT Frequency: 5x's/week              Contractures Contractures Info: Not present    Additional Factors Info  Allergies, Code Status Code Status Info: Full Allergies Info: NKDA           Current Medications (02/06/2020):  This is the current hospital active medication list Current Facility-Administered Medications  Medication Dose Route Frequency Provider Last Rate Last Admin  . 0.9 %  sodium chloride infusion   Intravenous Continuous Johnson, Clanford L, MD 50 mL/hr at 02/06/20 1500 New Bag at 02/06/20 1500  . amLODipine (NORVASC) tablet 2.5 mg  2.5 mg Oral Daily Johnson, Clanford L, MD   2.5 mg at 02/06/20 1457  . aspirin EC tablet 81 mg  81 mg Oral Daily Johnson, Clanford L, MD   81 mg at 02/06/20 1500  . bisacodyl (DULCOLAX) EC tablet 5 mg  5 mg Oral Daily PRN Johnson, Clanford L, MD      . enoxaparin (LOVENOX)  injection 30 mg  30 mg Subcutaneous Q24H Johnson, Clanford L, MD   30 mg at 02/06/20 1455  . insulin aspart (novoLOG) injection 0-9 Units  0-9 Units Subcutaneous TID WC Johnson, Clanford L, MD      . multivitamin with minerals tablet 1 tablet  1 tablet Oral Daily Johnson, Clanford L, MD   1 tablet at 02/06/20 1457  . ondansetron (ZOFRAN) tablet 4 mg  4 mg Oral Q6H PRN Johnson, Clanford L, MD       Or  . ondansetron (ZOFRAN) injection 4 mg  4 mg Intravenous Q6H PRN Johnson, Clanford L, MD      . traZODone (DESYREL) tablet 25 mg  25 mg Oral QHS PRN  Murlean Iba, MD       Current Outpatient Medications  Medication Sig Dispense Refill  . amLODipine (NORVASC) 2.5 MG tablet Take 2.5 mg by mouth daily.    Marland Kitchen aspirin 81 MG tablet Take 81 mg by mouth daily.    . Cholecalciferol (VITAMIN D3) 25 MCG (1000 UT) CAPS Take 1 capsule by mouth in the morning.    Marland Kitchen econazole nitrate 1 % cream Apply 1 application topically daily as needed (toe).    . ferrous gluconate (FERGON) 324 MG tablet Take 324 mg by mouth daily with breakfast.    . glimepiride (AMARYL) 2 MG tablet Take 2 mg by mouth daily with breakfast.    . latanoprost (XALATAN) 0.005 % ophthalmic solution Place 1 drop into both eyes every morning.    . Multiple Vitamin (MULTIVITAMIN WITH MINERALS) TABS tablet Take 1 tablet by mouth daily.       Discharge Medications: Please see discharge summary for a list of discharge medications.  Relevant Imaging Results:  Relevant Lab Results:   Additional Information SSN#: [id number]  Sherie Don, LCSW

## 2020-02-06 NOTE — ED Notes (Signed)
Jeneen Rinks and Robin in from PT to eval and treat.

## 2020-02-06 NOTE — ED Provider Notes (Signed)
[facility] EMERGENCY DEPARTMENT Provider Note   CSN: [id number] Arrival date & time: 02/06/20  3154     History Chief Complaint  Patient presents with  . Weakness    Jerome Chambers is a 86 y.o. male.  Patient brought in for weakness and hematuria.  His wife states he has dementia and she takes care of him and he has not been able to walk recently  The history is provided by the patient and a relative. No language interpreter was used.  Weakness Severity:  Moderate Onset quality:  Sudden Timing:  Constant Progression:  Worsening Chronicity:  New Context: not alcohol use   Relieved by:  Nothing Worsened by:  Nothing Ineffective treatments:  None tried Associated symptoms: no abdominal pain, no chest pain, no cough, no diarrhea, no frequency, no headaches and no seizures   Risk factors: no anemia        Past Medical History:  Diagnosis Date  . Allergic rhinitis, cause unspecified   . Dementia ([place])   . Diabetes ([place])   . Diverticulosis   . DJD (degenerative joint disease)   . Glaucoma   . History of Helicobacter pylori infection   . Sleep apnea    NO CPAP MACHINE   . Unspecified essential hypertension     Patient Active Problem List   Diagnosis Date Noted  . AKI (acute kidney injury) ([place]) 02/06/2020  . UTI (urinary tract infection)   . Cystitis   . Thrombocytopenia ([place])   . Anemia in chronic kidney disease (CKD)   . Gait instability   . At risk for falling   . Acute renal failure superimposed on stage 3 chronic kidney disease ([place]) 08/17/2017  . Acute kidney failure ([place]) 08/16/2017  . Senile dementia ([place]) 08/16/2017  . Type 2 diabetes mellitus without complication ([place]) 00/86/7619  . Special screening for malignant neoplasms, colon 07/03/2012  . ACHILLES BURSITIS OR TENDINITIS 02/07/2010  . OTHER SYMPTOMS INVOLVING DIGESTIVE SYSTEM OTHER 01/21/2009  . DIVERTICULOSIS OF COLON 11/16/2008  . SLEEP APNEA 11/16/2008  . HELICOBACTER PYLORI GASTRITIS  11/12/2008  . DM 11/12/2008  . Unspecified glaucoma 11/12/2008  . Essential hypertension 11/12/2008  . Allergic rhinitis 11/12/2008  . Osteoarthritis 11/12/2008  . LOW BACK PAIN SYNDROME 11/12/2008    History reviewed. No pertinent surgical history.     Family History  Problem Relation Age of Onset  . Colon cancer Neg Hx     Social History   Tobacco Use  . Smoking status: Never Smoker  . Smokeless tobacco: Never Used  Substance Use Topics  . Alcohol use: No  . Drug use: No    Home Medications Prior to Admission medications   Medication Sig Start Date End Date Taking? Authorizing Provider  amLODipine (NORVASC) 2.5 MG tablet Take 2.5 mg by mouth daily.   Yes [provider]  aspirin 81 MG tablet Take 81 mg by mouth daily.   Yes [provider]  Cholecalciferol (VITAMIN D3) 25 MCG (1000 UT) CAPS Take 1 capsule by mouth in the morning.   Yes [provider]  econazole nitrate 1 % cream Apply 1 application topically daily as needed (toe).   Yes [provider]  ferrous gluconate (FERGON) 324 MG tablet Take 324 mg by mouth daily with breakfast.   Yes [provider]  glimepiride (AMARYL) 2 MG tablet Take 2 mg by mouth daily with breakfast.   Yes [provider]  latanoprost (XALATAN) 0.005 % ophthalmic solution Place 1 drop into  both eyes every morning.   Yes [provider]  Multiple Vitamin (MULTIVITAMIN WITH MINERALS) TABS tablet Take 1 tablet by mouth daily.   Yes [provider]    Allergies    Patient has no known allergies.  Review of Systems   Review of Systems  Constitutional: Negative for appetite change and fatigue.  HENT: Negative for congestion, ear discharge and sinus pressure.   Eyes: Negative for discharge.  Respiratory: Negative for cough.   Cardiovascular: Negative for chest pain.  Gastrointestinal: Negative for abdominal pain and diarrhea.  Genitourinary: Negative for frequency and  hematuria.  Musculoskeletal: Negative for back pain.  Skin: Negative for rash.  Neurological: Positive for weakness. Negative for seizures and headaches.  Psychiatric/Behavioral: Negative for hallucinations.    Physical Exam Updated Vital Signs BP (!) 143/86   Pulse 90   Temp 98 F (36.7 C) (Oral)   Resp 18   Ht 5\' 11"  (1.803 m)   Wt 86 kg   SpO2 100%   BMI 26.44 kg/m   Physical Exam Vitals and nursing note reviewed.  Constitutional:      Appearance: He is well-developed.  HENT:     Head: Normocephalic.     Nose: Nose normal.  Eyes:     General: No scleral icterus.    Conjunctiva/sclera: Conjunctivae normal.  Neck:     Thyroid: No thyromegaly.  Cardiovascular:     Rate and Rhythm: Normal rate and regular rhythm.     Heart sounds: No murmur heard.  No friction rub. No gallop.   Pulmonary:     Breath sounds: No stridor. No wheezing or rales.  Chest:     Chest wall: No tenderness.  Abdominal:     General: There is no distension.     Tenderness: There is no abdominal tenderness. There is no rebound.  Musculoskeletal:        General: Normal range of motion.     Cervical back: Neck supple.  Lymphadenopathy:     Cervical: No cervical adenopathy.  Skin:    Findings: No erythema or rash.  Neurological:     Motor: No abnormal muscle tone.     Coordination: Coordination normal.     Comments: Oriented to person only.  Patient is alert but confused this is normal for him  Psychiatric:        Behavior: Behavior normal.     ED Results / Procedures / Treatments   Labs (all labs ordered are listed, but only abnormal results are displayed) Labs Reviewed  CBC WITH DIFFERENTIAL/PLATELET - Abnormal; Notable for the following components:      Result Value   RBC 3.34 (*)    Hemoglobin 11.1 (*)    HCT 33.9 (*)    MCV 101.5 (*)    Platelets 120 (*)    Lymphs Abs 0.5 (*)    All other components within normal limits  COMPREHENSIVE METABOLIC PANEL - Abnormal; Notable for  the following components:   CO2 21 (*)    Glucose, Bld 139 (*)    BUN 43 (*)    Creatinine, Ser 2.53 (*)    AST 79 (*)    Alkaline Phosphatase 448 (*)    GFR calc non Af Amer 22 (*)    GFR calc Af Amer 25 (*)    All other components within normal limits  URINALYSIS, ROUTINE W REFLEX MICROSCOPIC - Abnormal; Notable for the following components:   Color, Urine AMBER (*)    APPearance HAZY (*)  Hgb urine dipstick LARGE (*)    Ketones, ur 5 (*)    Protein, ur >=300 (*)    RBC / HPF >50 (*)    WBC, UA >50 (*)    All other components within normal limits  SARS CORONAVIRUS 2 BY RT PCR (HOSPITAL ORDER, [place] [facility])  URINE CULTURE    EKG None  Radiology DG Chest Special View  Result Date: 02/06/2020 CLINICAL DATA:  Nodular densities in the lung bases. EXAM: CHEST SPECIAL VIEW COMPARISON:  Chest x-ray 02/06/2020 FINDINGS: Trachea midline. Cardiomediastinal contours and hilar structures are normal. Lungs are clear. Nipple markers have been placed over nodular densities at the lung bases compatible with nipple shadows. Visualized skeletal structures on limited assessment without acute process. IMPRESSION: 1. No acute cardiopulmonary disease. 2. Nipple shadows at the lung bases. Electronically Signed   By: Zetta Bills M.D.   On: 02/06/2020 08:36   DG Chest Port 1 View  Result Date: 02/06/2020 CLINICAL DATA:  Weakness EXAM: PORTABLE CHEST 1 VIEW COMPARISON:  10/12/2006 FINDINGS: The heart size and mediastinal contours are within normal limits. Rounded 8 mm nodular densities project over the peripheral aspects of the bilateral lung bases, favored to represent nipple shadows. The lungs are otherwise clear. No focal airspace consolidation, pleural effusion, or pneumothorax. The visualized skeletal structures are unremarkable. IMPRESSION: 1. No active cardiopulmonary disease. 2. Probable nipple shadows at the bilateral lung bases. Repeat radiograph with nipple markers  could be performed to confirm. Electronically Signed   By: Davina Poke D.O.   On: 02/06/2020 08:00    Procedures Procedures (including critical care time)  Medications Ordered in ED Medications  sodium chloride 0.9 % bolus 500 mL (0 mLs Intravenous Stopped 02/06/20 0954)  cefTRIAXone (ROCEPHIN) 1 g in sodium chloride 0.9 % 100 mL IVPB (0 g Intravenous Stopped 02/06/20 1030)  sodium chloride 0.9 % bolus 500 mL (0 mLs Intravenous Stopped 02/06/20 1030)    ED Course  I have reviewed the triage vital signs and the nursing notes.  Pertinent labs & imaging results that were available during my care of the patient were reviewed by me and considered in my medical decision making (see chart for details).    MDM Rules/Calculators/A&P                          Patient with AKI urinary tract infection and significant fatigue he is unable to ambulate.  He will be admitted to medicine         This patient presents to the ED for concern of weakness, this involves an extensive number of treatment options, and is a complaint that carries with it a high risk of complications and morbidity.  The differential diagnosis includes UTI MI pneumonia   Lab Tests:  I Ordered, reviewed, and interpreted labs, which included CBC with chemistries urinalysis which shows mild anemia dehydration and urinary tract infection Medicines ordered:   I ordered medication Rocephin for UTI  Imaging Studies ordered:   I ordered imaging studies which included chest x-ray and  I independently visualized and interpreted imaging which showed no acute disease  Additional history obtained:   Additional history obtained from records and wife  Previous records obtained and reviewed.  Consultations Obtained:   I consulted hospitalist and discussed lab and imaging findings  Reevaluation:  After the interventions stated above, I reevaluated the patient and found improved  Critical  Interventions:  .  Final Clinical Impression(s) / ED Diagnoses Final diagnoses:  Acute cystitis with hematuria    Rx / DC Orders ED Discharge Orders    None       Milton Ferguson, MD 02/06/20 1255

## 2020-02-06 NOTE — ED Notes (Signed)
C/o having difficulty voiding.  Dr Roderic Palau aware and plan is to do U/S.

## 2020-02-06 NOTE — TOC Initial Note (Signed)
Transition of Care [facility]) - Initial/Assessment Note   Patient Details  Name: Jerome Chambers MRN: [id number] Date of Birth: [birth year]  [facility]) CM/SW Contact:    Sherie Don, LCSW Phone Number: 02/06/2020, 4:58 PM  Clinical Narrative: Patient is an 86 year old male who was admitted for AKI. TOC received consult for SNF placement. PT evaluation recommended SNF. CSW spoke with patient's wife, Wilho Sharpley, to discuss SNF as patient has dementia. Wife agreeable to SNF and requested TOC follow up with her tomorrow to make referrals to facilities as she would prefer a specific facility in [place], but cannot remember the name. FL2 completed and PASRR received. TOC to follow.  Expected Discharge Plan: Skilled Nursing Facility Barriers to Discharge: Continued Medical Work up  Patient Goals and CMS Choice Patient states their goals for this hospitalization and ongoing recovery are:: Discharge home after SNF CMS Medicare.gov Compare Post Acute Care list provided to:: Patient Represenative (must comment) Felizardo Hoffmann (wife)) Choice offered to / list presented to : Spouse  Expected Discharge Plan and Services Expected Discharge Plan: [place] In-house Referral: Clinical Social Work Discharge Planning Services: NA Post Acute Care Choice: [place] Living arrangements for the past 2 months: [place]  Prior Living Arrangements/Services Living arrangements for the past 2 months: Single Family Home Lives with:: Spouse Patient language and need for interpreter reviewed:: Yes Do you feel safe going back to the place where you live?: Yes      Need for Family Participation in Patient Care: Yes (Comment) (Patient has dementia) Care giver support system in place?: Yes (comment) Irie Fiorello (wife) Ph: [phone]) Criminal Activity/Legal Involvement Pertinent to Current Situation/Hospitalization: No - Comment as needed  Permission  Sought/Granted Permission sought to share information with : Facility Art therapist granted to share info w AGENCY: SNFs  Emotional Assessment Appearance:: Appears stated age Attitude/Demeanor/Rapport: Unable to Assess Affect (typically observed): Unable to Assess Orientation: : Oriented to Self Alcohol / Substance Use: Not Applicable Psych Involvement: No (comment)  Admission diagnosis:  AKI (acute kidney injury) ([place]) [N17.9] Patient Active Problem List   Diagnosis Date Noted   AKI (acute kidney injury) ([place]) 02/06/2020   UTI (urinary tract infection)    Cystitis    Thrombocytopenia (H[facility])    Anemia in chronic kidney disease (CKD)    Gait instability    At risk for falling    Acute renal failure superimposed on stage 3 chronic kidney disease ([place]) 08/17/2017   Acute kidney failure ([place]) 08/16/2017   Senile dementia ([place]) 08/16/2017   Type 2 diabetes mellitus without complication ([place]) 64/33/2951   Special screening for malignant neoplasms, colon 07/03/2012   ACHILLES BURSITIS OR TENDINITIS 02/07/2010   OTHER SYMPTOMS INVOLVING DIGESTIVE SYSTEM OTHER 01/21/2009   DIVERTICULOSIS OF COLON 11/16/2008   SLEEP APNEA 88/41/6606   HELICOBACTER PYLORI GASTRITIS 11/12/2008   DM 11/12/2008   Unspecified glaucoma 11/12/2008   Essential hypertension 11/12/2008   Allergic rhinitis 11/12/2008   Osteoarthritis 11/12/2008   LOW BACK PAIN SYNDROME 11/12/2008   PCP:  Rosita Fire, MD Pharmacy:   [id number] - [place], [address] AT [address] [place] [address] Phone: [phone] Fax: [id number] - [place], [place] AT [address] [place] [address] Phone: [phone] Fax: [phone]  Readmission Risk Interventions No flowsheet data found.

## 2020-02-06 NOTE — ED Notes (Signed)
Dr Wynetta Emery in to see pt, given Estill Bamberg (wife) telephone number.

## 2020-02-06 NOTE — Evaluation (Signed)
Physical Therapy Evaluation Patient Details Name: Jerome Chambers MRN: [id number] DOB: 12-Jul-1929 Today's Date: 02/06/2020   History of Present Illness  Jerome Chambers is a 86 y.o. male with medical history significant of dementia, stage 3 CKD, type 2 diabetes mellitus, OSA not on CPAP, HTN, glaucoma who has been having symptoms of weakness and malaise over the past week.  He has been working with his primary care physician and they have been evaluating him for gallbladder disease.  He had an episode yesterday where he stood up from a seated position and passed out.  He fell on his right arm.  He was unable to stand up after that.  His wife had to get assistance to get him up off the floor.  He had complained of some right arm pain other wise he had no complaints.  He is still having difficulty ambulating.  His wife reports that he has been fully vaccinated for COVID-19.    Clinical Impression  Patient requires repeated verbal/tactile cuing to complete functional tasks, very unsteady on RLE with frequent incoordination, appears to lack awareness of where RLE is when attempting sit to stands and taking steps with RW, limited to a few very unsteady side steps with frequent near falls prevented with Max/mod assist.  Plan:  Patient will benefit from continued physical therapy in hospital and recommended venue below to increase strength, balance, endurance for safe ADLs and gait.    Follow Up Recommendations SNF    Equipment Recommendations  Rolling walker with 5" wheels    Recommendations for Other Services       Precautions / Restrictions Precautions Precautions: Fall Restrictions Weight Bearing Restrictions: No      Mobility  Bed Mobility Overal bed mobility: Needs Assistance Bed Mobility: Supine to Sit;Sit to Supine     Supine to sit: Mod assist Sit to supine: Mod assist   General bed mobility comments: increased time, labored movement  Transfers Overall transfer level: Needs  assistance Equipment used: Rolling walker (2 wheeled);1 person hand held assist Transfers: Sit to/from [facility] Sit to Stand: Mod assist Stand pivot transfers: Mod assist;Max assist       General transfer comment: very unsteady on RLE with frequent buckling of knee  Ambulation/Gait Ambulation/Gait assistance: Max assist Gait Distance (Feet): 4 Feet Assistive device: Rolling walker (2 wheeled) Gait Pattern/deviations: Decreased step length - right;Decreased step length - left;Decreased stance time - right;Decreased stride length;Shuffle Gait velocity: slow   General Gait Details: limited to 4-5 very short labored unsteady steps with shuffling of RLE, unable to maintain standing balance  Stairs            Wheelchair Mobility    Modified Rankin (Stroke Patients Only)       Balance Overall balance assessment: Needs assistance Sitting-balance support: No upper extremity supported;Feet supported Sitting balance-Leahy Scale: Fair Sitting balance - Comments: seated at EOB   Standing balance support: Bilateral upper extremity supported;During functional activity Standing balance-Leahy Scale: Poor Standing balance comment: using RW                             Pertinent Vitals/Pain Pain Assessment: No/denies pain    Home Living Family/patient expects to be discharged to:: Private residence Living Arrangements: Spouse/significant other Available Help at Discharge: Family;Available PRN/intermittently Type of Home: House Home Access: Stairs to enter;Level entry Entrance Stairs-Rails: None Entrance Stairs-Number of Steps: 2 in front, 2 steps with 1 siderail in  garage Home Layout: One level Home Equipment: None      Prior Function Level of Independence: Independent;Needs assistance   Gait / Transfers Assistance Needed: household and short distanced community ambulator without AD  ADL's / Homemaking Assistance Needed: assisted by family         Hand Dominance   Dominant Hand: Right    Extremity/Trunk Assessment   Upper Extremity Assessment Upper Extremity Assessment: Generalized weakness    Lower Extremity Assessment Lower Extremity Assessment: Generalized weakness;RLE deficits/detail;LLE deficits/detail RLE Deficits / Details: grossly 3+/5 RLE Sensation: WNL RLE Coordination: decreased fine motor;decreased gross motor LLE Deficits / Details: grossly 5/5 LLE Sensation: WNL LLE Coordination: WNL    Cervical / Trunk Assessment Cervical / Trunk Assessment: Normal  Communication   Communication: HOH  Cognition Arousal/Alertness: Awake/alert Behavior During Therapy: WFL for tasks assessed/performed Overall Cognitive Status: History of cognitive impairments - at baseline                                        General Comments      Exercises     Assessment/Plan    PT Assessment Patient needs continued PT services  PT Problem List Decreased strength;Decreased activity tolerance;Decreased balance;Decreased mobility;Decreased coordination       PT Treatment Interventions Gait training;Stair training;Functional mobility training;Therapeutic activities;Therapeutic exercise;Balance training;Neuromuscular re-education;Patient/family education    PT Goals (Current goals can be found in the Care Plan section)  Acute Rehab PT Goals Patient Stated Goal: return home PT Goal Formulation: With patient/family Time For Goal Achievement: 02/20/20 Potential to Achieve Goals: Good    Frequency Min 3X/week   Barriers to discharge        Co-evaluation               AM-PAC PT "6 Clicks" Mobility  Outcome Measure Help needed turning from your back to your side while in a flat bed without using bedrails?: A Little Help needed moving from lying on your back to sitting on the side of a flat bed without using bedrails?: A Lot Help needed moving to and from a bed to a chair (including a  wheelchair)?: A Lot Help needed standing up from a chair using your arms (e.g., wheelchair or bedside chair)?: A Lot Help needed to walk in hospital room?: Total Help needed climbing 3-5 steps with a railing? : Total 6 Click Score: 11    End of Session   Activity Tolerance: Patient tolerated treatment well;Patient limited by fatigue Patient left: in bed;with call [place]/phone within reach;with family/visitor present Nurse Communication: Mobility status PT Visit Diagnosis: Unsteadiness on feet (R26.81);Other abnormalities of gait and mobility (R26.89);Muscle weakness (generalized) (M62.81)    Time: 4709-6283 PT Time Calculation (min) (ACUTE ONLY): 31 min   Charges:   PT Evaluation $PT Eval Moderate Complexity: 1 Mod PT Treatments $Therapeutic Activity: 23-37 mins        4:14 PM, 02/06/20 Lonell Grandchild, MPT Physical Therapist with [facility] 336 [phone] office [phone] mobile phone

## 2020-02-06 NOTE — ED Notes (Signed)
Attempted to ambulate.  Pt not able to bear wear or hold self up without falling backward while sitting.

## 2020-02-07 DIAGNOSIS — N3090 Cystitis, unspecified without hematuria: Secondary | ICD-10-CM | POA: Diagnosis not present

## 2020-02-07 DIAGNOSIS — N1832 Chronic kidney disease, stage 3b: Secondary | ICD-10-CM | POA: Diagnosis not present

## 2020-02-07 DIAGNOSIS — I10 Essential (primary) hypertension: Secondary | ICD-10-CM | POA: Diagnosis not present

## 2020-02-07 DIAGNOSIS — N179 Acute kidney failure, unspecified: Secondary | ICD-10-CM | POA: Diagnosis not present

## 2020-02-07 LAB — CBC WITH DIFFERENTIAL/PLATELET
Abs Immature Granulocytes: 0.02 10*3/uL (ref 0.00–0.07)
Basophils Absolute: 0 10*3/uL (ref 0.0–0.1)
Basophils Relative: 0 %
Eosinophils Absolute: 0.1 10*3/uL (ref 0.0–0.5)
Eosinophils Relative: 1 %
HCT: 34.3 % — ABNORMAL LOW (ref 39.0–52.0)
Hemoglobin: 11 g/dL — ABNORMAL LOW (ref 13.0–17.0)
Immature Granulocytes: 0 %
Lymphocytes Relative: 18 %
Lymphs Abs: 1.1 10*3/uL (ref 0.7–4.0)
MCH: 32.4 pg (ref 26.0–34.0)
MCHC: 32.1 g/dL (ref 30.0–36.0)
MCV: 101.2 fL — ABNORMAL HIGH (ref 80.0–100.0)
Monocytes Absolute: 0.6 10*3/uL (ref 0.1–1.0)
Monocytes Relative: 9 %
Neutro Abs: 4.6 10*3/uL (ref 1.7–7.7)
Neutrophils Relative %: 72 %
Platelets: 131 10*3/uL — ABNORMAL LOW (ref 150–400)
RBC: 3.39 MIL/uL — ABNORMAL LOW (ref 4.22–5.81)
RDW: 13.5 % (ref 11.5–15.5)
WBC: 6.4 10*3/uL (ref 4.0–10.5)
nRBC: 0 % (ref 0.0–0.2)

## 2020-02-07 LAB — GLUCOSE, CAPILLARY
Glucose-Capillary: 103 mg/dL — ABNORMAL HIGH (ref 70–99)
Glucose-Capillary: 97 mg/dL (ref 70–99)
Glucose-Capillary: 133 mg/dL — ABNORMAL HIGH (ref 70–99)
Glucose-Capillary: 139 mg/dL — ABNORMAL HIGH (ref 70–99)
Glucose-Capillary: 160 mg/dL — ABNORMAL HIGH (ref 70–99)

## 2020-02-07 LAB — BASIC METABOLIC PANEL
Anion gap: 13 (ref 5–15)
BUN: 35 mg/dL — ABNORMAL HIGH (ref 8–23)
CO2: 24 mmol/L (ref 22–32)
Calcium: 9.9 mg/dL (ref 8.9–10.3)
Chloride: 104 mmol/L (ref 98–111)
Creatinine, Ser: 1.89 mg/dL — ABNORMAL HIGH (ref 0.61–1.24)
GFR calc Af Amer: 36 mL/min — ABNORMAL LOW (ref >60)
GFR calc non Af Amer: 31 mL/min — ABNORMAL LOW (ref >60)
Glucose, Bld: 101 mg/dL — ABNORMAL HIGH (ref 70–99)
Potassium: 5.6 mmol/L — ABNORMAL HIGH (ref 3.5–5.1)
Sodium: 141 mmol/L (ref 135–145)

## 2020-02-07 LAB — PROTIME-INR
INR: 1.3 — ABNORMAL HIGH (ref 0.8–1.2)
Prothrombin Time: 15.3 seconds — ABNORMAL HIGH (ref 11.4–15.2)

## 2020-02-07 MED ORDER — SODIUM ZIRCONIUM CYCLOSILICATE 10 G PO PACK
10.0000 g | PACK | Freq: Three times a day (TID) | ORAL | Status: AC
  Administered 2020-02-07 – 2020-02-08 (×3): 10 g via ORAL
  Filled 2020-02-07 (×3): qty 1

## 2020-02-07 MED ORDER — ALUM & MAG HYDROXIDE-SIMETH 200-200-20 MG/5ML PO SUSP
30.0000 mL | Freq: Four times a day (QID) | ORAL | Status: DC | PRN
  Administered 2020-02-07 – 2020-02-09 (×2): 30 mL via ORAL
  Filled 2020-02-07 (×2): qty 30

## 2020-02-07 MED ORDER — METOPROLOL TARTRATE 5 MG/5ML IV SOLN: 2.5000 mg | Freq: Four times a day (QID) | INTRAVENOUS | Status: DC | PRN

## 2020-02-07 MED ORDER — AMLODIPINE BESYLATE 5 MG PO TABS
5.0000 mg | ORAL_TABLET | Freq: Every day | ORAL | Status: DC
  Administered 2020-02-07 – 2020-02-12 (×6): 5 mg via ORAL
  Filled 2020-02-07 (×5): qty 1

## 2020-02-07 MED ORDER — NEPRO/CARBSTEADY PO LIQD
237.0000 mL | ORAL | Status: DC
  Administered 2020-02-07 – 2020-02-08 (×2): 237 mL via ORAL

## 2020-02-07 NOTE — Progress Notes (Signed)
PROGRESS NOTE   Jerome Chambers  FXT:[id number] DOB: June 05, 1929 DOA: 02/06/2020 PCP: Rosita Fire, MD   Chief Complaint  Patient presents with  . Weakness    Brief Admission History:  86 y.o. male with medical history significant of dementia, stage 3 CKD, type 2 diabetes mellitus, OSA not on CPAP, HTN, glaucoma who has been having symptoms of weakness and malaise over the past week.  He has been working with his primary care physician and they have been evaluating him for gallbladder disease.  He had an episode yesterday where he stood up from a seated position and passed out.  He fell on his right arm.  He was unable to stand up after that.  His wife had to get assistance to get him up off the floor.  He had complained of some right arm pain other wise he had no complaints.  He is still having difficulty ambulating.  His wife reports that he has been fully vaccinated for COVID-19.  Assessment & Plan:   Principal Problem:   AKI (acute kidney injury) ([place]) Active Problems:   Unspecified glaucoma   Essential hypertension   Allergic rhinitis   Osteoarthritis   Senile dementia ([place])   Type 2 diabetes mellitus without complication (HCC)   Acute renal failure superimposed on stage 3 chronic kidney disease (HCC)   UTI (urinary tract infection)   Cystitis   Thrombocytopenia (HCC)   Anemia in chronic kidney disease (CKD)   Gait instability   At risk for falling  1. AKI on stage 3 CKD - improving, suspect prerenal as he is clinically dehydrated and wife confirms poor oral intake over last 2 days prior to admission.  Treating with gentle IV fluids and recheck BMP in AM.  Hold nephrotoxic agents and renally dose meds as needed.  2. Cystitis - Pt presents with urinary findings of acute cystitis.  Continue ceftriaxone IV and follow urine culture results.  UC pending.  3. Thrombocytopenia - improved today, platelets up to 131.   4. Type 2 diabetes mellitus - SSI coverage and CBG testing ordered.   5. Dementia - delirium precautions, family planning to stay with him as much as possible.  6. Gait instability / generalized weakness - PT evaluation recommending SNF placement, TOC consulted to assist with placement.   7. Right arm pain from fall - xray negative for fracture.   8. HTN - resume home meds and follow.   DVT prophylaxis: lovenox   Code Status: full   Family Communication:  Wife updated at bedside   Disposition Plan: wife agreeable to SNF rehab  Consults called: PT  Admission status: INP  Status is: Inpatient  Remains inpatient appropriate because:IV treatments appropriate due to intensity of illness or inability to take PO and Inpatient level of care appropriate due to severity of illness Awaiting SNF placement.   Dispo: The patient is from: Home              Anticipated d/c is to: SNF              Anticipated d/c date is: 2 days              Patient currently is medically stable to d/c.  Consultants:   dietitian  Procedures:     Antimicrobials:    Subjective: Pt reports no complaints, ate most of breakfast, wife was present to feed.   Objective: Vitals:   02/07/20 0045 02/07/20 0451 02/07/20 0900 02/07/20 1300  BP: (!) 147/85 Marland Kitchen)  156/88 (!) 138/77 (!) 133/71  Pulse: 78 66 69 72  Resp: 16 16 18 16   Temp: 97.7 F (36.5 C) 97.7 F (36.5 C) 98 F (36.7 C) 98.3 F (36.8 C)  TempSrc: Oral Oral    SpO2: 99% 100% 99% 99%  Weight:      Height:        Intake/Output Summary (Last 24 hours) at 02/07/2020 1623 Last data filed at 02/07/2020 0900 Gross per 24 hour  Intake 777.02 ml  Output --  Net 777.02 ml   Filed Weights   02/06/20 0630 02/06/20 2043  Weight: 86 kg 77 kg    Examination:  General exam: Appears calm and comfortable  Respiratory system: Clear to auscultation. Respiratory effort normal. Cardiovascular system: S1 & S2 heard, RRR. No JVD, murmurs, rubs, gallops or clicks. No pedal edema. Gastrointestinal system: Abdomen is  nondistended, soft and nontender. No organomegaly or masses felt. Normal bowel sounds heard. Central nervous system: Alert and oriented. No focal neurological deficits. Extremities: Symmetric 5 x 5 power. Skin: No rashes, lesions or ulcers Psychiatry: Judgement and insight appear normal. Mood & affect appropriate.   Data Reviewed: I have personally reviewed following labs and imaging studies  CBC: Recent Labs  Lab 02/06/20 0811 02/07/20 0602  WBC 7.8 6.4  NEUTROABS 6.7 4.6  HGB 11.1* 11.0*  HCT 33.9* 34.3*  MCV 101.5* 101.2*  PLT 120* 131*    Basic Metabolic Panel: Recent Labs  Lab 02/06/20 0811 02/07/20 0602  NA 136 141  K 4.6 5.6*  CL 100 104  CO2 21* 24  GLUCOSE 139* 101*  BUN 43* 35*  CREATININE 2.53* 1.89*  CALCIUM 9.5 9.9    GFR: Estimated Creatinine Clearance: 28.2 mL/min (A) (by C-G formula based on SCr of 1.89 mg/dL (H)).  Liver Function Tests: Recent Labs  Lab 02/06/20 0811  AST 79*  ALT 40  ALKPHOS 448*  BILITOT 1.2  PROT 8.0  ALBUMIN 3.7    CBG: Recent Labs  Lab 02/06/20 1756 02/06/20 2055 02/07/20 0420 02/07/20 0731 02/07/20 1110  GLUCAP 130* 97 103* 97 133*    Recent Results (from the past 240 hour(s))  SARS Coronavirus 2 by RT PCR (hospital order, performed in [facility] hospital lab) Nasopharyngeal Nasopharyngeal Swab     Status: None   Collection Time: 02/06/20  8:10 AM   Specimen: Nasopharyngeal Swab  Result Value Ref Range Status   SARS Coronavirus 2 NEGATIVE NEGATIVE Final    Comment: (NOTE) SARS-CoV-2 target nucleic acids are NOT DETECTED.  The SARS-CoV-2 RNA is generally detectable in upper and lower respiratory specimens during the acute phase of infection. The lowest concentration of SARS-CoV-2 viral copies this assay can detect is 250 copies / mL. A negative result does not preclude SARS-CoV-2 infection and should not be used as the sole basis for treatment or other patient management decisions.  A negative result  may occur with improper specimen collection / handling, submission of specimen other than nasopharyngeal swab, presence of viral mutation(s) within the areas targeted by this assay, and inadequate number of viral copies (<250 copies / mL). A negative result must be combined with clinical observations, patient history, and epidemiological information.  Fact Sheet for Patients:   StrictlyIdeas.no  Fact Sheet for Healthcare Providers: BankingDealers.co.za  This test is not yet approved or  cleared by the [place] FDA and has been authorized for detection and/or diagnosis of SARS-CoV-2 by FDA under an Emergency Use Authorization (EUA).  This EUA will remain  in effect (meaning this test can be used) for the duration of the COVID-19 declaration under Section 564(b)(1) of the Act, 21 U.S.C. section 360bbb-3(b)(1), unless the authorization is terminated or revoked sooner.  Performed at [facility], [address] [place], [place] [address]      Radiology Studies: DG Chest Special View  Result Date: 02/06/2020 CLINICAL DATA:  Nodular densities in the lung bases. EXAM: CHEST SPECIAL VIEW COMPARISON:  Chest x-ray 02/06/2020 FINDINGS: Trachea midline. Cardiomediastinal contours and hilar structures are normal. Lungs are clear. Nipple markers have been placed over nodular densities at the lung bases compatible with nipple shadows. Visualized skeletal structures on limited assessment without acute process. IMPRESSION: 1. No acute cardiopulmonary disease. 2. Nipple shadows at the lung bases. Electronically Signed   By: Zetta Bills M.D.   On: 02/06/2020 08:36   DG Chest Port 1 View  Result Date: 02/06/2020 CLINICAL DATA:  Weakness EXAM: PORTABLE CHEST 1 VIEW COMPARISON:  10/12/2006 FINDINGS: The heart size and mediastinal contours are within normal limits. Rounded 8 mm nodular densities project over the peripheral aspects of the bilateral lung  bases, favored to represent nipple shadows. The lungs are otherwise clear. No focal airspace consolidation, pleural effusion, or pneumothorax. The visualized skeletal structures are unremarkable. IMPRESSION: 1. No active cardiopulmonary disease. 2. Probable nipple shadows at the bilateral lung bases. Repeat radiograph with nipple markers could be performed to confirm. Electronically Signed   By: Davina Poke D.O.   On: 02/06/2020 08:00   DG Humerus Right  Result Date: 02/06/2020 CLINICAL DATA:  Right arm pain common no known injury, initial encounter EXAM: RIGHT HUMERUS - 2+ VIEW COMPARISON:  None. FINDINGS: No acute fracture or dislocation is noted. Degenerative changes of the acromioclavicular joint are seen. Olecranon spurring is noted as well. IMPRESSION: No acute abnormality noted. Electronically Signed   By: Inez Catalina M.D.   On: 02/06/2020 13:27   Scheduled Meds: . amLODipine  5 mg Oral Daily  . aspirin EC  81 mg Oral Daily  . cholecalciferol  1,000 Units Oral q AM  . enoxaparin (LOVENOX) injection  30 mg Subcutaneous Q24H  . feeding supplement (NEPRO CARB STEADY)  237 mL Oral Q24H  . ferrous gluconate  324 mg Oral Q breakfast  . insulin aspart  0-9 Units Subcutaneous TID WC  . latanoprost  1 drop Both Eyes BH-q7a  . multivitamin with minerals  1 tablet Oral Daily  . sodium zirconium cyclosilicate  10 g Oral TID   Continuous Infusions: . sodium chloride 50 mL/hr at 02/07/20 1209  . cefTRIAXone (ROCEPHIN)  IV 1 g (02/07/20 0833)     LOS: 1 day   Time spent: 22 mins    Tonatiuh Mallon Wynetta Emery, MD How to contact the [facility] Attending or Consulting provider [place] or covering provider during after hours [place], for this patient?  1. Check the care team in [facility] and look for a) attending/consulting TRH provider listed and b) the [facility] team listed 2. Log into [email or web address] and use [place]'s universal password to access. If you do not have the password, please contact the hospital  operator. 3. Locate the [facility] provider you are looking for under [facility] and page to a number that you can be directly reached. 4. If you still have difficulty reaching the provider, please page the [facility] (Director on Call) for the Hospitalists listed on amion for assistance.  02/07/2020, 4:23 PM

## 2020-02-07 NOTE — Progress Notes (Addendum)
Initial Nutrition Assessment  DOCUMENTATION CODES:   Not applicable  INTERVENTION:  Nepro Shake po daily, each supplement provides 425 kcal and 19 grams protein (butter pecan or vanilla)  Nursing staff to provide feeding assistance with meals d/t IV in right hand limiting pt mobility   NUTRITION DIAGNOSIS:   Inadequate oral intake related to poor appetite as evidenced by per patient/family report, percent weight loss.    GOAL:   Patient will meet greater than or equal to 90% of their needs    MONITOR:   Labs, I & O's, Supplement acceptance, PO intake, Weight trends  REASON FOR ASSESSMENT:   Malnutrition Screening Tool, Consult Other (Comment) (abnormal wt loss)  ASSESSMENT:  RD working remotely.  86 year old male admitted for acute kidney injury presented with difficulty ambulating, weakness and malaise over the past week. Past medical history significant of dementia, CKD stage III, DM2, OSA not on CPAP, glaucoma, and being evaluated for gallbladder disease by PCP.  Patient is oriented to person only, noted alert but confused at baseline.  Able to speak with pt wife Estill Bamberg) this afternoon via phone. She reports pt ate a good breakfasts, recalls most of omelette and some of his oatmeal. She reports that he did not eat very much of his lunch, says he did not like the rice and usually he likes green beans, but did not eat them today. He is on regular diet at home, says he is not a big eater in general since he has gotten older, however appetite has been very poor over the past few weeks. Wife reports that she makes breakfast every moring recalls toast, eggs with cheese sometimes bacon or sausage. Lately, pt has been eating a small bowl of oatmeal and likes fruit, says he does well with casseroles and likes greens, [place], chicken breast, drinks sweet tea and diet mellow yellow. Recommended trying 1-2 nutrient dense supplements at home to aid with meeting needs, will order Nepro  supplement given noted hyperkalemia.   Wife denies any chewing/swallowing difficulties. Wife states that she has been having to provide feeding assistance during admission d/t IV in pt right hand decreasing mobility. RD will order feeding assistance with meals.   No recent wt history for review, last weight prior to admission 85.3 kg (187.66 lb) on 04/03/18. Wife keeps a record of weights and vitals from various appointments and is able to provide recent history. Per wife report, pt weighed 190 lb at PCP in 12/20, he weighed 191 lb at nephrologist office in 02/21 and on 01/15/20 he weighed 180.4 lbs at PCP. Currently pt weighs 77 kg (169.4 lb). This indicates a ~11 lb (6.1%) wt loss in the last 3 weeks and 20 lbs (10.8%) over the past 5 months which is significant. Given trends, history of dementia as well as advanced age, highly suspect malnutrition however unable to identify at this time. Will plan to complete nutrition-focused physical exam at follow-up.  I/Os: +1637 ml since admit Medications reviewed and include: D3, Gergon, SSI, MVI, Lokelma IVF: NaCl @ 50 ml/hr IVPB: Rocephin Labs: CBGs 133,97,103,97 K 5.6 (H), BUN 35 (H), Cr 1.89 (H   NUTRITION - FOCUSED PHYSICAL EXAM: Unable to complete at this time, RD working remotely.  Diet Order:   Diet Order            DIET DYS 3 Room service appropriate? Yes; Fluid consistency: Thin  Diet effective now  EDUCATION NEEDS:   Education needs have been addressed  Skin:  Skin Assessment: Reviewed RN Assessment  Last BM:  7/23  Height:   Ht Readings from Last 1 Encounters:  02/06/20 5\' 11"  (1.803 m)    Weight:   Wt Readings from Last 1 Encounters:  02/06/20 77 kg    BMI:  Body mass index is 23.68 kg/m.  Estimated Nutritional Needs:   Kcal:  3335-4562  Protein:  85-96  Fluid:  1.9 L  Lajuan Lines, RD, LDN Clinical Nutrition After Hours/Weekend Pager # in [place]

## 2020-02-08 DIAGNOSIS — Z9181 History of falling: Secondary | ICD-10-CM | POA: Diagnosis not present

## 2020-02-08 DIAGNOSIS — N3090 Cystitis, unspecified without hematuria: Secondary | ICD-10-CM | POA: Diagnosis not present

## 2020-02-08 DIAGNOSIS — N1832 Chronic kidney disease, stage 3b: Secondary | ICD-10-CM | POA: Diagnosis not present

## 2020-02-08 DIAGNOSIS — N179 Acute kidney failure, unspecified: Secondary | ICD-10-CM | POA: Diagnosis not present

## 2020-02-08 LAB — BASIC METABOLIC PANEL
Anion gap: 11 (ref 5–15)
BUN: 32 mg/dL — ABNORMAL HIGH (ref 8–23)
CO2: 23 mmol/L (ref 22–32)
Calcium: 9.2 mg/dL (ref 8.9–10.3)
Chloride: 103 mmol/L (ref 98–111)
Creatinine, Ser: 1.99 mg/dL — ABNORMAL HIGH (ref 0.61–1.24)
GFR calc Af Amer: 34 mL/min — ABNORMAL LOW (ref >60)
GFR calc non Af Amer: 29 mL/min — ABNORMAL LOW (ref >60)
Glucose, Bld: 115 mg/dL — ABNORMAL HIGH (ref 70–99)
Potassium: 4.4 mmol/L (ref 3.5–5.1)
Sodium: 137 mmol/L (ref 135–145)

## 2020-02-08 LAB — URINE CULTURE: Culture: <10000 — AB

## 2020-02-08 LAB — GLUCOSE, CAPILLARY
Glucose-Capillary: 107 mg/dL — ABNORMAL HIGH (ref 70–99)
Glucose-Capillary: 137 mg/dL — ABNORMAL HIGH (ref 70–99)
Glucose-Capillary: 110 mg/dL — ABNORMAL HIGH (ref 70–99)
Glucose-Capillary: 139 mg/dL — ABNORMAL HIGH (ref 70–99)

## 2020-02-08 LAB — MAGNESIUM: Magnesium: 1.8 mg/dL (ref 1.7–2.4)

## 2020-02-08 MED ORDER — ECONAZOLE NITRATE 1 % EX CREA: 1.0000 "application " | TOPICAL_CREAM | Freq: Every day | CUTANEOUS | Status: DC | PRN

## 2020-02-08 MED ORDER — ECONAZOLE NITRATE 1 % EX CREA
1.0000 "application " | TOPICAL_CREAM | Freq: Every day | CUTANEOUS | Status: DC | PRN
  Filled 2020-02-08: qty 15

## 2020-02-08 MED ORDER — TERBINAFINE HCL 1 % EX CREA
TOPICAL_CREAM | Freq: Every day | CUTANEOUS | Status: DC | PRN
  Administered 2020-02-09: 1 via TOPICAL
  Filled 2020-02-08: qty 12

## 2020-02-08 NOTE — Progress Notes (Addendum)
PROGRESS NOTE   Jerome Chambers  TZG:[id number] DOB: 05/29/1929 DOA: 02/06/2020 PCP: Rosita Fire, MD   Chief Complaint  Patient presents with   Weakness    Brief Admission History:  85 y.o. male with medical history significant of dementia, stage 3b CKD, type 2 diabetes mellitus, OSA not on CPAP, HTN, glaucoma who has been having symptoms of weakness and malaise over the past week.  He has been working with his primary care physician and they have been evaluating him for gallbladder disease.  He had an episode yesterday where he stood up from a seated position and passed out.  He fell on his right arm.  He was unable to stand up after that.  His wife had to get assistance to get him up off the floor.  He had complained of some right arm pain other wise he had no complaints.  He is still having difficulty ambulating.  His wife reports that he has been fully vaccinated for COVID-19.  Assessment & Plan:   Principal Problem:   AKI (acute kidney injury) ([place]) Active Problems:   Unspecified glaucoma   Essential hypertension   Allergic rhinitis   Osteoarthritis   Senile dementia (HCC)   Type 2 diabetes mellitus without complication (HCC)   Acute renal failure superimposed on stage 3 chronic kidney disease (HCC)   UTI (urinary tract infection)   Cystitis   Thrombocytopenia (HCC)   Anemia in chronic kidney disease (CKD)   Gait instability   At risk for falling  AKI on stage 3b CKD - improving, suspect prerenal as he is clinically dehydrated and wife confirms poor oral intake over last 2 days prior to admission.  Treating with gentle IV fluids and recheck BMP in AM.  Hold nephrotoxic agents and renally dose meds as needed.  Cystitis - TREATED.  Pt presented with urinary findings of acute cystitis.  Continue ceftriaxone IV and follow urine culture results.  UC insignificant growth.  Thrombocytopenia - improved today, platelets up to 131.   Type 2 diabetes mellitus - SSI coverage and CBG testing  ordered.  Dementia - delirium precautions, family planning to stay with him as much as possible.  Gait instability / generalized weakness - PT evaluation recommending SNF placement, TOC consulted to assist with placement.   Right arm pain from fall - xray negative for fracture.   HTN - resume home meds and follow.    DVT prophylaxis: lovenox   Code Status: full   Family Communication:  Wife updated at bedside   Disposition Plan: wife agreeable to SNF rehab  Consults called: PT  Admission status: INP  Status is: Inpatient  Remains inpatient appropriate because:IV treatments appropriate due to intensity of illness or inability to take PO and Inpatient level of care appropriate due to severity of illness Awaiting SNF placement.   Dispo: The patient is from: Home              Anticipated d/c is to: SNF              Anticipated d/c date is: 1 day              Patient currently is medically stable to d/c.  Consultants:  dietitian  Procedures:    Antimicrobials:    Subjective: Pt is eating a little better, he had some acid reflux yesterday.    Objective: Vitals:   02/07/20 2028 02/07/20 2139 02/08/20 0546 02/08/20 0552  BP:  (!) 127/103 125/78 124/76  Pulse:  93 78 75  Resp:  16 16   Temp:  98.4 F (36.9 C) 97.7 F (36.5 C)   TempSrc:  Oral Oral   SpO2: 94% 99% 98%   Weight:      Height:        Intake/Output Summary (Last 24 hours) at 02/08/2020 1700 Last data filed at 02/08/2020 1300 Gross per 24 hour  Intake 480 ml  Output --  Net 480 ml   Filed Weights   02/06/20 0630 02/06/20 2043  Weight: 86 kg 77 kg    Examination:  General exam: Appears calm and comfortable  Respiratory system: Clear to auscultation. Respiratory effort normal. Cardiovascular system: S1 & S2 heard, RRR. No JVD, murmurs, rubs, gallops or clicks. No pedal edema. Gastrointestinal system: Abdomen is nondistended, soft and nontender. No organomegaly or masses felt. Normal bowel sounds  heard. Central nervous system: Alert and oriented. No focal neurological deficits. Extremities: Symmetric 5 x 5 power. Skin: No rashes, lesions or ulcers Psychiatry: Judgement and insight appear normal. Mood & affect appropriate.   Data Reviewed: I have personally reviewed following labs and imaging studies  CBC: Recent Labs  Lab 02/06/20 0811 02/07/20 0602  WBC 7.8 6.4  NEUTROABS 6.7 4.6  HGB 11.1* 11.0*  HCT 33.9* 34.3*  MCV 101.5* 101.2*  PLT 120* 131*    Basic Metabolic Panel: Recent Labs  Lab 02/06/20 0811 02/07/20 0602 02/08/20 0603  NA 136 141 137  K 4.6 5.6* 4.4  CL 100 104 103  CO2 21* 24 23  GLUCOSE 139* 101* 115*  BUN 43* 35* 32*  CREATININE 2.53* 1.89* 1.99*  CALCIUM 9.5 9.9 9.2  MG  --   --  1.8    GFR: Estimated Creatinine Clearance: 26.8 mL/min (A) (by C-G formula based on SCr of 1.99 mg/dL (H)).  Liver Function Tests: Recent Labs  Lab 02/06/20 0811  AST 79*  ALT 40  ALKPHOS 448*  BILITOT 1.2  PROT 8.0  ALBUMIN 3.7    CBG: Recent Labs  Lab 02/07/20 1625 02/07/20 2146 02/08/20 0750 02/08/20 1147 02/08/20 1648  GLUCAP 139* 160* 107* 137* 110*    Recent Results (from the past 240 hour(s))  SARS Coronavirus 2 by RT PCR (hospital order, performed in [facility] hospital lab) Nasopharyngeal Nasopharyngeal Swab     Status: None   Collection Time: 02/06/20  8:10 AM   Specimen: Nasopharyngeal Swab  Result Value Ref Range Status   SARS Coronavirus 2 NEGATIVE NEGATIVE Final    Comment: (NOTE) SARS-CoV-2 target nucleic acids are NOT DETECTED.  The SARS-CoV-2 RNA is generally detectable in upper and lower respiratory specimens during the acute phase of infection. The lowest concentration of SARS-CoV-2 viral copies this assay can detect is 250 copies / mL. A negative result does not preclude SARS-CoV-2 infection and should not be used as the sole basis for treatment or other patient management decisions.  A negative result may occur  with improper specimen collection / handling, submission of specimen other than nasopharyngeal swab, presence of viral mutation(s) within the areas targeted by this assay, and inadequate number of viral copies (<250 copies / mL). A negative result must be combined with clinical observations, patient history, and epidemiological information.  Fact Sheet for Patients:   StrictlyIdeas.no  Fact Sheet for Healthcare Providers: BankingDealers.co.za  This test is not yet approved or  cleared by the [place] FDA and has been authorized for detection and/or diagnosis of SARS-CoV-2 by FDA under an Emergency Use Authorization (EUA).  This EUA will remain in effect (meaning this test can be used) for the duration of the COVID-19 declaration under Section 564(b)(1) of the Act, 21 U.S.C. section 360bbb-3(b)(1), unless the authorization is terminated or revoked sooner.  Performed at [facility], [address] [place], [place] [address]   Urine Culture     Status: Abnormal   Collection Time: 02/06/20  9:27 AM   Specimen: Urine, Clean Catch  Result Value Ref Range Status   Specimen Description   Final    URINE, CLEAN CATCH Performed at [facility], [address], [place], [place] [address]    Special Requests   Final    NONE Performed at [facility], [address], [place], [place] [address]    Culture (A)  Final    <10,000 COLONIES/mL INSIGNIFICANT GROWTH Performed at [place] [address] [place], [place] [address]    Report Status 02/08/2020 FINAL  Final     Radiology Studies: No results found. Scheduled Meds:  amLODipine  5 mg Oral Daily   aspirin EC  81 mg Oral Daily   cholecalciferol  1,000 Units Oral q AM   enoxaparin (LOVENOX) injection  30 mg Subcutaneous Q24H   feeding supplement (NEPRO CARB STEADY)  237 mL Oral Q24H   ferrous gluconate  324 mg Oral Q breakfast   insulin aspart  0-9 Units Subcutaneous TID  WC   latanoprost  1 drop Both Eyes BH-q7a   multivitamin with minerals  1 tablet Oral Daily   Continuous Infusions:  sodium chloride 50 mL/hr at 02/08/20 0843     LOS: 2 days   Time spent: 15 mins    Ilijah Doucet Wynetta Emery, MD How to contact the [facility] Attending or Consulting provider [place] or covering provider during after hours [place], for this patient?  Check the care team in [facility] and look for a) attending/consulting TRH provider listed and b) the [facility] team listed Log into [email or web address] and use [place]'s universal password to access. If you do not have the password, please contact the hospital operator. Locate the [facility] provider you are looking for under [facility] and page to a number that you can be directly reached. If you still have difficulty reaching the provider, please page the [facility] (Director on Call) for the Hospitalists listed on amion for assistance.  02/08/2020, 5:00 PM

## 2020-02-08 NOTE — Progress Notes (Signed)
Discussed CPAP usage with patient, due to his dementia he is unable to provide clear information but seemed willing to try it. Called his wife and talked to her about it. She said to try to get him to wear one tonight but if he becomes uncompliant or refuses to wear it then not to worry about it. Will attempt to place patient on cpap after he has received all po meds for the night.

## 2020-02-09 ENCOUNTER — Ambulatory Visit (HOSPITAL_COMMUNITY): Payer: Medicare Other

## 2020-02-09 DIAGNOSIS — N3090 Cystitis, unspecified without hematuria: Secondary | ICD-10-CM | POA: Diagnosis not present

## 2020-02-09 DIAGNOSIS — Z9181 History of falling: Secondary | ICD-10-CM | POA: Diagnosis not present

## 2020-02-09 DIAGNOSIS — N1832 Chronic kidney disease, stage 3b: Secondary | ICD-10-CM | POA: Diagnosis not present

## 2020-02-09 DIAGNOSIS — N179 Acute kidney failure, unspecified: Secondary | ICD-10-CM | POA: Diagnosis not present

## 2020-02-09 LAB — BASIC METABOLIC PANEL
Anion gap: 13 (ref 5–15)
BUN: 36 mg/dL — ABNORMAL HIGH (ref 8–23)
CO2: 22 mmol/L (ref 22–32)
Calcium: 9.1 mg/dL (ref 8.9–10.3)
Chloride: 102 mmol/L (ref 98–111)
Creatinine, Ser: 2.01 mg/dL — ABNORMAL HIGH (ref 0.61–1.24)
GFR calc Af Amer: 33 mL/min — ABNORMAL LOW (ref >60)
GFR calc non Af Amer: 29 mL/min — ABNORMAL LOW (ref >60)
Glucose, Bld: 127 mg/dL — ABNORMAL HIGH (ref 70–99)
Potassium: 4.1 mmol/L (ref 3.5–5.1)
Sodium: 137 mmol/L (ref 135–145)

## 2020-02-09 LAB — GLUCOSE, CAPILLARY
Glucose-Capillary: 122 mg/dL — ABNORMAL HIGH (ref 70–99)
Glucose-Capillary: 125 mg/dL — ABNORMAL HIGH (ref 70–99)
Glucose-Capillary: 140 mg/dL — ABNORMAL HIGH (ref 70–99)
Glucose-Capillary: 166 mg/dL — ABNORMAL HIGH (ref 70–99)
Glucose-Capillary: 162 mg/dL — ABNORMAL HIGH (ref 70–99)

## 2020-02-09 MED ORDER — PANTOPRAZOLE SODIUM 40 MG IV SOLR
40.0000 mg | INTRAVENOUS | Status: DC
  Administered 2020-02-09 – 2020-02-13 (×5): 40 mg via INTRAVENOUS
  Filled 2020-02-09 (×5): qty 40

## 2020-02-09 MED ORDER — PROCHLORPERAZINE EDISYLATE 10 MG/2ML IJ SOLN
5.0000 mg | Freq: Once | INTRAMUSCULAR | Status: AC
  Administered 2020-02-09: 5 mg via INTRAVENOUS
  Filled 2020-02-09: qty 2

## 2020-02-09 MED ORDER — ACETAMINOPHEN 650 MG RE SUPP
650.0000 mg | Freq: Four times a day (QID) | RECTAL | Status: DC
  Filled 2020-02-09 (×3): qty 1

## 2020-02-09 MED ORDER — FENTANYL CITRATE (PF) 100 MCG/2ML IJ SOLN
12.5000 ug | INTRAMUSCULAR | Status: DC | PRN
  Administered 2020-02-13 (×2): 12.5 ug via INTRAVENOUS
  Filled 2020-02-09 (×2): qty 2

## 2020-02-09 MED ORDER — ACETAMINOPHEN 325 MG PO TABS
650.0000 mg | ORAL_TABLET | Freq: Four times a day (QID) | ORAL | Status: DC
  Administered 2020-02-09 – 2020-02-13 (×13): 650 mg via ORAL
  Filled 2020-02-09 (×12): qty 2

## 2020-02-09 NOTE — Progress Notes (Signed)
Patient not placed on CPAP due to patient being confused from his dementia. He has also been throwing up.  RT will continue to monitor.

## 2020-02-09 NOTE — Progress Notes (Signed)
PROGRESS NOTE   Jerome Chambers  JJH:[id number] DOB: Oct 30, 1928 DOA: 02/06/2020 PCP: Rosita Fire, MD   Chief Complaint  Patient presents with  . Weakness    Brief Admission History:  86 y.o. male with medical history significant of dementia, stage 3b CKD, type 2 diabetes mellitus, OSA not on CPAP, HTN, glaucoma who has been having symptoms of weakness and malaise over the past week.  He has been working with his primary care physician and they have been evaluating him for gallbladder disease.  He had an episode yesterday where he stood up from a seated position and passed out.  He fell on his right arm.  He was unable to stand up after that.  His wife had to get assistance to get him up off the floor.  He had complained of some right arm pain other wise he had no complaints.  He is still having difficulty ambulating.  His wife reports that he has been fully vaccinated for COVID-19.  Assessment & Plan:   Principal Problem:   AKI (acute kidney injury) ([place]) Active Problems:   Unspecified glaucoma   Essential hypertension   Allergic rhinitis   Osteoarthritis   Senile dementia ([place])   Type 2 diabetes mellitus without complication (HCC)   Acute renal failure superimposed on stage 3 chronic kidney disease (HCC)   UTI (urinary tract infection)   Cystitis   Thrombocytopenia (HCC)   Anemia in chronic kidney disease (CKD)   Gait instability   At risk for falling  1. AKI on stage 3b CKD - improving, suspect prerenal as he is clinically dehydrated and wife confirms poor oral intake over last 2 days prior to admission.  Treating with gentle IV fluids and recheck BMP in AM.  Hold nephrotoxic agents and renally dose meds as needed.  2. Cystitis - TREATED.  Pt presented with urinary findings of acute cystitis.  Continue ceftriaxone IV and follow urine culture results.  UC insignificant growth.  3. Thrombocytopenia - improved, platelets up to 131.   4. Type 2 diabetes mellitus - SSI coverage and CBG  testing ordered.  BS controlled.  5. Dementia - delirium precautions, family planning to stay with him as much as possible.  6. Gait instability / generalized weakness - PT evaluation recommending SNF placement, TOC consulted to assist with placement.  Still awaiting bed placement.  7. Right arm pain from fall - xray negative for fracture.  Tylenol as needed for pain.  8. HTN - resume home meds and follow.    DVT prophylaxis: lovenox   Code Status: full   Family Communication:  Wife updated at bedside   Disposition Plan: wife agreeable to SNF rehab  Consults called: PT  Admission status: INP  Status is: Inpatient  Remains inpatient appropriate because:IV treatments appropriate due to intensity of illness or inability to take PO and Inpatient level of care appropriate due to severity of illness Awaiting SNF placement.   Dispo: The patient is from: Home              Anticipated d/c is to: SNF              Anticipated d/c date is: 1 day              Patient currently is medically stable to d/c.  Consultants:   dietitian  Procedures:     Antimicrobials:    Subjective: Pt requiring assistance with meals.  Wife at bedside.   Pt tolerated cpap last night.  No complaints.   Objective: Vitals:   02/08/20 1942 02/08/20 2144 02/09/20 0431 02/09/20 1411  BP:  (!) 134/73 (!) 140/80 126/75  Pulse:  95  88  Resp:  18 18 16   Temp:  98 F (36.7 C) 100.1 F (37.8 C) 99.1 F (37.3 C)  TempSrc:  Oral Oral Oral  SpO2: 94% 99%  100%  Weight:      Height:        Intake/Output Summary (Last 24 hours) at 02/09/2020 1457 Last data filed at 02/08/2020 1700 Gross per 24 hour  Intake 240 ml  Output 900 ml  Net -660 ml   Filed Weights   02/06/20 0630 02/06/20 2043  Weight: 86 kg 77 kg    Examination:  General exam: Appears calm and comfortable  Respiratory system: Clear to auscultation. Respiratory effort normal. Cardiovascular system: S1 & S2 heard, RRR. No JVD, murmurs, rubs,  gallops or clicks. No pedal edema. Gastrointestinal system: Abdomen is nondistended, soft and nontender. No organomegaly or masses felt. Normal bowel sounds heard. Central nervous system: Alert and oriented. No focal neurological deficits. Extremities: Symmetric 5 x 5 power. Skin: No rashes, lesions or ulcers Psychiatry: Judgement and insight appear normal. Mood & affect appropriate.   Data Reviewed: I have personally reviewed following labs and imaging studies  CBC: Recent Labs  Lab 02/06/20 0811 02/07/20 0602  WBC 7.8 6.4  NEUTROABS 6.7 4.6  HGB 11.1* 11.0*  HCT 33.9* 34.3*  MCV 101.5* 101.2*  PLT 120* 131*    Basic Metabolic Panel: Recent Labs  Lab 02/06/20 0811 02/07/20 0602 02/08/20 0603 02/09/20 0459  NA 136 141 137 137  K 4.6 5.6* 4.4 4.1  CL 100 104 103 102  CO2 21* 24 23 22   GLUCOSE 139* 101* 115* 127*  BUN 43* 35* 32* 36*  CREATININE 2.53* 1.89* 1.99* 2.01*  CALCIUM 9.5 9.9 9.2 9.1  MG  --   --  1.8  --     GFR: Estimated Creatinine Clearance: 26.5 mL/min (A) (by C-G formula based on SCr of 2.01 mg/dL (H)).  Liver Function Tests: Recent Labs  Lab 02/06/20 0811  AST 79*  ALT 40  ALKPHOS 448*  BILITOT 1.2  PROT 8.0  ALBUMIN 3.7    CBG: Recent Labs  Lab 02/08/20 1648 02/08/20 2141 02/09/20 0357 02/09/20 0726 02/09/20 1129  GLUCAP 110* 139* 122* 125* 140*    Recent Results (from the past 240 hour(s))  SARS Coronavirus 2 by RT PCR (hospital order, performed in [facility] hospital lab) Nasopharyngeal Nasopharyngeal Swab     Status: None   Collection Time: 02/06/20  8:10 AM   Specimen: Nasopharyngeal Swab  Result Value Ref Range Status   SARS Coronavirus 2 NEGATIVE NEGATIVE Final    Comment: (NOTE) SARS-CoV-2 target nucleic acids are NOT DETECTED.  The SARS-CoV-2 RNA is generally detectable in upper and lower respiratory specimens during the acute phase of infection. The lowest concentration of SARS-CoV-2 viral copies this assay can  detect is 250 copies / mL. A negative result does not preclude SARS-CoV-2 infection and should not be used as the sole basis for treatment or other patient management decisions.  A negative result may occur with improper specimen collection / handling, submission of specimen other than nasopharyngeal swab, presence of viral mutation(s) within the areas targeted by this assay, and inadequate number of viral copies (<250 copies / mL). A negative result must be combined with clinical observations, patient history, and epidemiological information.  Fact Sheet for  Patients:   StrictlyIdeas.no  Fact Sheet for Healthcare Providers: BankingDealers.co.za  This test is not yet approved or  cleared by the [place] FDA and has been authorized for detection and/or diagnosis of SARS-CoV-2 by FDA under an Emergency Use Authorization (EUA).  This EUA will remain in effect (meaning this test can be used) for the duration of the COVID-19 declaration under Section 564(b)(1) of the Act, 21 U.S.C. section 360bbb-3(b)(1), unless the authorization is terminated or revoked sooner.  Performed at [facility], [address] [place], [place] [address]   Urine Culture     Status: Abnormal   Collection Time: 02/06/20  9:27 AM   Specimen: Urine, Clean Catch  Result Value Ref Range Status   Specimen Description   Final    URINE, CLEAN CATCH Performed at [facility], [address], [place], [place] [address]    Special Requests   Final    NONE Performed at [facility], [address], [place], [place] [address]    Culture (A)  Final    <10,000 COLONIES/mL INSIGNIFICANT GROWTH Performed at [facility] [address], [place], [place] [address]    Report Status 02/08/2020 FINAL  Final     Radiology Studies: No results found. Scheduled Meds: . amLODipine  5 mg Oral Daily  . aspirin EC  81 mg Oral Daily  . cholecalciferol  1,000 Units Oral q  AM  . enoxaparin (LOVENOX) injection  30 mg Subcutaneous Q24H  . feeding supplement (NEPRO CARB STEADY)  237 mL Oral Q24H  . ferrous gluconate  324 mg Oral Q breakfast  . insulin aspart  0-9 Units Subcutaneous TID WC  . latanoprost  1 drop Both Eyes [id number]  . multivitamin with minerals  1 tablet Oral Daily   Continuous Infusions: . sodium chloride 35 mL/hr at 02/09/20 1301     LOS: 3 days   Time spent: 15 mins    Anais Denslow Wynetta Emery, MD How to contact the [facility] Attending or Consulting provider [place] or covering provider during after hours [place], for this patient?  1. Check the care team in [facility] and look for a) attending/consulting [facility] provider listed and b) the [facility] team listed 2. Log into [email or web address] and use [place]'s universal password to access. If you do not have the password, please contact the hospital operator. 3. Locate the [facility] provider you are looking for under [facility] and page to a number that you can be directly reached. 4. If you still have difficulty reaching the provider, please page the [facility] (Director on Call) for the Hospitalists listed on amion for assistance.  02/09/2020, 2:57 PM

## 2020-02-09 NOTE — Progress Notes (Signed)
Physical Therapy Treatment Patient Details Name: Jerome Chambers MRN: [id number] DOB: 1928/12/21 Today's Date: 02/09/2020    History of Present Illness Jerome Chambers is a 86 y.o. male with medical history significant of dementia, stage 3 CKD, type 2 diabetes mellitus, OSA not on CPAP, HTN, glaucoma who has been having symptoms of weakness and malaise over the past week.  He has been working with his primary care physician and they have been evaluating him for gallbladder disease.  He had an episode yesterday where he stood up from a seated position and passed out.  He fell on his right arm.  He was unable to stand up after that.  His wife had to get assistance to get him up off the floor.  He had complained of some right arm pain other wise he had no complaints.  He is still having difficulty ambulating.  His wife reports that he has been fully vaccinated for COVID-19.    PT Comments    Patient demonstrates slow labored movement for sitting up at bedside with constant verbal/tactile cueing, c/o discomfort RUE during supine to sitting, requires repeated verbal/tactile cueing to complete exercises, has difficulty lifting RLE against gravity mostly due to weakness and/or poor carryover for following instructions.  Patient very unsteady on feet and limited to a few side steps due to RLE buckling and fall risk.  Patient tolerated sitting up in chair after therapy with his spouse present at bedside - RN aware.  Patient will benefit from continued physical therapy in hospital and recommended venue below to increase strength, balance, endurance for safe ADLs and gait.   Follow Up Recommendations  SNF     Equipment Recommendations  Rolling walker with 5" wheels    Recommendations for Other Services       Precautions / Restrictions Precautions Precautions: Fall Restrictions Weight Bearing Restrictions: No    Mobility  Bed Mobility Overal bed mobility: Needs Assistance Bed Mobility: Supine to Sit      Supine to sit: Mod assist;Max assist     General bed mobility comments: increased time, labored movement, repeated verbal/tactile cueing, limited use of RUE due elbow pain  Transfers Overall transfer level: Needs assistance Equipment used: Rolling walker (2 wheeled) Transfers: Sit to/from [facility] Sit to Stand: Mod assist Stand pivot transfers: Mod assist;Max assist       General transfer comment: slow unsteady labored movement  Ambulation/Gait Ambulation/Gait assistance: Max assist Gait Distance (Feet): 4 Feet Assistive device: Rolling walker (2 wheeled) Gait Pattern/deviations: Decreased step length - right;Decreased step length - left;Decreased stance time - right;Decreased stride length;Shuffle Gait velocity: slow   General Gait Details: limited to 4-5 slow unsteady labored side steps with frequent buckling or right knee due to weakness   Stairs             Wheelchair Mobility    Modified Rankin (Stroke Patients Only)       Balance Overall balance assessment: Needs assistance Sitting-balance support: Feet supported;No upper extremity supported Sitting balance-Leahy Scale: Fair Sitting balance - Comments: occasional leaning backwards when completing execises   Standing balance support: During functional activity;Bilateral upper extremity supported Standing balance-Leahy Scale: Poor Standing balance comment: using RW                            Cognition Arousal/Alertness: Awake/alert Behavior During Therapy: WFL for tasks assessed/performed Overall Cognitive Status: History of cognitive impairments - at baseline  General Comments: requires repeated verbal/tactile cueing to follow instructions      Exercises General Exercises - Lower Extremity Ankle Circles/Pumps: Seated;AROM;Strengthening;Both;5 reps Long Arc Quad: Seated;AROM;AAROM;Both;15 reps;Strengthening Hip  Flexion/Marching: Seated;AROM;AAROM;Both;10 reps;Strengthening    General Comments        Pertinent Vitals/Pain Pain Assessment: Faces Faces Pain Scale: Hurts little more Pain Location: RLE Pain Descriptors / Indicators: Sore Pain Intervention(s): Limited activity within patient's tolerance;Monitored during session    Home Living                      Prior Function            PT Goals (current goals can now be found in the care plan section) Acute Rehab PT Goals Patient Stated Goal: return home PT Goal Formulation: With patient/family Time For Goal Achievement: 02/20/20 Potential to Achieve Goals: Good Progress towards PT goals: Progressing toward goals    Frequency    Min 3X/week      PT Plan Current plan remains appropriate    Co-evaluation              AM-PAC PT "6 Clicks" Mobility   Outcome Measure  Help needed turning from your back to your side while in a flat bed without using bedrails?: A Lot Help needed moving from lying on your back to sitting on the side of a flat bed without using bedrails?: A Lot Help needed moving to and from a bed to a chair (including a wheelchair)?: A Lot Help needed standing up from a chair using your arms (e.g., wheelchair or bedside chair)?: A Lot Help needed to walk in hospital room?: Total Help needed climbing 3-5 steps with a railing? : Total 6 Click Score: 10    End of Session   Activity Tolerance: Patient tolerated treatment well;Patient limited by fatigue Patient left: in chair;with call [place]/phone within reach;with family/visitor present Nurse Communication: Mobility status PT Visit Diagnosis: Unsteadiness on feet (R26.81);Other abnormalities of gait and mobility (R26.89);Muscle weakness (generalized) (M62.81)     Time: 0459-9774 PT Time Calculation (min) (ACUTE ONLY): 30 min  Charges:  $Therapeutic Exercise: 8-22 mins $Therapeutic Activity: 8-22 mins                     12:36 PM,  02/09/20 Lonell Grandchild, MPT Physical Therapist with [facility] [phone] office [phone] mobile phone

## 2020-02-09 NOTE — Care Management Important Message (Signed)
Important Message  Patient Details  Name: Jerome Chambers MRN: [id number] Date of Birth: Nov 21, 1928   Medicare Important Message Given:  Yes     Tommy Medal 02/09/2020, 4:20 PM

## 2020-02-10 ENCOUNTER — Inpatient Hospital Stay (HOSPITAL_COMMUNITY): Payer: Medicare Other

## 2020-02-10 DIAGNOSIS — Z9181 History of falling: Secondary | ICD-10-CM | POA: Diagnosis not present

## 2020-02-10 DIAGNOSIS — N3090 Cystitis, unspecified without hematuria: Secondary | ICD-10-CM | POA: Diagnosis not present

## 2020-02-10 DIAGNOSIS — N179 Acute kidney failure, unspecified: Secondary | ICD-10-CM | POA: Diagnosis not present

## 2020-02-10 DIAGNOSIS — N1832 Chronic kidney disease, stage 3b: Secondary | ICD-10-CM | POA: Diagnosis not present

## 2020-02-10 LAB — CBC WITH DIFFERENTIAL/PLATELET
Abs Immature Granulocytes: 0.04 10*3/uL (ref 0.00–0.07)
Basophils Absolute: 0 10*3/uL (ref 0.0–0.1)
Basophils Relative: 0 %
Eosinophils Absolute: 0 10*3/uL (ref 0.0–0.5)
Eosinophils Relative: 0 %
HCT: 32.8 % — ABNORMAL LOW (ref 39.0–52.0)
Hemoglobin: 10.6 g/dL — ABNORMAL LOW (ref 13.0–17.0)
Immature Granulocytes: 0 %
Lymphocytes Relative: 7 %
Lymphs Abs: 0.8 10*3/uL (ref 0.7–4.0)
MCH: 32.9 pg (ref 26.0–34.0)
MCHC: 32.3 g/dL (ref 30.0–36.0)
MCV: 101.9 fL — ABNORMAL HIGH (ref 80.0–100.0)
Monocytes Absolute: 0.7 10*3/uL (ref 0.1–1.0)
Monocytes Relative: 6 %
Neutro Abs: 9.8 10*3/uL — ABNORMAL HIGH (ref 1.7–7.7)
Neutrophils Relative %: 87 %
Platelets: 136 10*3/uL — ABNORMAL LOW (ref 150–400)
RBC: 3.22 MIL/uL — ABNORMAL LOW (ref 4.22–5.81)
RDW: 13.3 % (ref 11.5–15.5)
WBC: 11.4 10*3/uL — ABNORMAL HIGH (ref 4.0–10.5)
nRBC: 0 % (ref 0.0–0.2)

## 2020-02-10 LAB — COMPREHENSIVE METABOLIC PANEL
ALT: 39 U/L (ref 0–44)
AST: 83 U/L — ABNORMAL HIGH (ref 15–41)
Albumin: 2.9 g/dL — ABNORMAL LOW (ref 3.5–5.0)
Alkaline Phosphatase: 376 U/L — ABNORMAL HIGH (ref 38–126)
Anion gap: 13 (ref 5–15)
BUN: 35 mg/dL — ABNORMAL HIGH (ref 8–23)
CO2: 21 mmol/L — ABNORMAL LOW (ref 22–32)
Calcium: 9.1 mg/dL (ref 8.9–10.3)
Chloride: 103 mmol/L (ref 98–111)
Creatinine, Ser: 1.87 mg/dL — ABNORMAL HIGH (ref 0.61–1.24)
GFR calc Af Amer: 36 mL/min — ABNORMAL LOW (ref >60)
GFR calc non Af Amer: 31 mL/min — ABNORMAL LOW (ref >60)
Glucose, Bld: 160 mg/dL — ABNORMAL HIGH (ref 70–99)
Potassium: 4.9 mmol/L (ref 3.5–5.1)
Sodium: 137 mmol/L (ref 135–145)
Total Bilirubin: 1.3 mg/dL — ABNORMAL HIGH (ref 0.3–1.2)
Total Protein: 7.3 g/dL (ref 6.5–8.1)

## 2020-02-10 LAB — GLUCOSE, CAPILLARY
Glucose-Capillary: 160 mg/dL — ABNORMAL HIGH (ref 70–99)
Glucose-Capillary: 155 mg/dL — ABNORMAL HIGH (ref 70–99)
Glucose-Capillary: 118 mg/dL — ABNORMAL HIGH (ref 70–99)
Glucose-Capillary: 150 mg/dL — ABNORMAL HIGH (ref 70–99)
Glucose-Capillary: 133 mg/dL — ABNORMAL HIGH (ref 70–99)

## 2020-02-10 MED ORDER — BISACODYL 10 MG RE SUPP
10.0000 mg | Freq: Once | RECTAL | Status: AC
  Administered 2020-02-10: 10 mg via RECTAL
  Filled 2020-02-10: qty 1

## 2020-02-10 MED ORDER — SENNOSIDES-DOCUSATE SODIUM 8.6-50 MG PO TABS
1.0000 | ORAL_TABLET | Freq: Two times a day (BID) | ORAL | Status: DC
  Administered 2020-02-10 – 2020-02-12 (×6): 1 via ORAL
  Filled 2020-02-10 (×6): qty 1

## 2020-02-10 NOTE — Progress Notes (Signed)
PROGRESS NOTE   Jerome Chambers  GYJ:[id number] DOB: [birth year] DOA: 02/06/2020 PCP: Rosita Fire, MD   Chief Complaint  Patient presents with  . Weakness    Brief Admission History:  86 y.o. male with medical history significant of dementia, stage 3b CKD, type 2 diabetes mellitus, OSA not on CPAP, HTN, glaucoma who has been having symptoms of weakness and malaise over the past week.  He has been working with his primary care physician and they have been evaluating him for gallbladder disease.  He had an episode yesterday where he stood up from a seated position and passed out.  He fell on his right arm.  He was unable to stand up after that.  His wife had to get assistance to get him up off the floor.  He had complained of some right arm pain other wise he had no complaints.  He is still having difficulty ambulating.  His wife reports that he has been fully vaccinated for COVID-19.  Assessment & Plan:   Principal Problem:   AKI (acute kidney injury) ([place]) Active Problems:   Unspecified glaucoma   Essential hypertension   Allergic rhinitis   Osteoarthritis   Senile dementia ([place])   Type 2 diabetes mellitus without complication (HCC)   Acute renal failure superimposed on stage 3 chronic kidney disease (HCC)   UTI (urinary tract infection)   Cystitis   Thrombocytopenia (HCC)   Anemia in chronic kidney disease (CKD)   Gait instability   At risk for falling  1. AKI on stage 3b CKD - improving, suspect prerenal as he is clinically dehydrated and wife confirms poor oral intake over last 2 days prior to admission.  Treating with gentle IV fluids and recheck BMP in AM.  Hold nephrotoxic agents and renally dose meds as needed.  2. Cystitis - TREATED.  Pt presented with urinary findings of acute cystitis.  Continue ceftriaxone IV and follow urine culture results.  UC insignificant growth.  3. Thrombocytopenia - improved, platelets up to 131.   4. Type 2 diabetes mellitus - SSI coverage and CBG  testing ordered.  BS controlled.  5. Dementia - delirium precautions, family planning to stay with him as much as possible.  6. Gait instability / generalized weakness - PT evaluation recommending SNF placement, TOC consulted to assist with placement.  Still awaiting bed placement.  7. Right arm pain from fall - xray negative for fracture.  Tylenol as needed for pain.  8. HTN - resume home meds and follow.  9. Constipation - added peri-colace BID and given suppository.    DVT prophylaxis: lovenox   Code Status: full   Family Communication:  Wife updated at bedside   Disposition Plan: wife agreeable to SNF rehab  Consults called: PT  Admission status: INP  Status is: Inpatient  Remains inpatient appropriate because:IV treatments appropriate due to intensity of illness or inability to take PO and Inpatient level of care appropriate due to severity of illness Awaiting SNF placement.   Dispo: The patient is from: Home              Anticipated d/c is to: SNF              Anticipated d/c date is: 1 day              Patient currently is medically stable to d/c.  Consultants:   dietitian  Procedures:     Antimicrobials:    Subjective: Pt has been vomiting yesterday but  ate breakfast this morning.    Objective: Vitals:   02/09/20 1411 02/09/20 2128 02/10/20 0529 02/10/20 1536  BP: 126/75 (!) 142/91 (!) 137/77 (!) 146/91  Pulse: 88 92 80 81  Resp: 16 18 17 18   Temp: 99.1 F (37.3 C) 99.1 F (37.3 C) 98 F (36.7 C) 98.4 F (36.9 C)  TempSrc: Oral Oral Oral Oral  SpO2: 100% 98% 100% 100%  Weight:      Height:        Intake/Output Summary (Last 24 hours) at 02/10/2020 1914 Last data filed at 02/10/2020 1634 Gross per 24 hour  Intake 360 ml  Output 1750 ml  Net -1390 ml   Filed Weights   02/06/20 0630 02/06/20 2043  Weight: 86 kg 77 kg    Examination:  General exam: Appears calm and comfortable  Respiratory system: Clear to auscultation. Respiratory effort  normal. Cardiovascular system: S1 & S2 heard, RRR. No JVD, murmurs, rubs, gallops or clicks. No pedal edema. Gastrointestinal system: Abdomen is nondistended, soft and nontender. No organomegaly or masses felt. Normal bowel sounds heard. Central nervous system: Alert and oriented. No focal neurological deficits. Extremities: Symmetric 5 x 5 power. Skin: No rashes, lesions or ulcers Psychiatry: Judgement and insight appear normal. Mood & affect appropriate.   Data Reviewed: I have personally reviewed following labs and imaging studies  CBC: Recent Labs  Lab 02/06/20 0811 02/07/20 0602 02/10/20 0412  WBC 7.8 6.4 11.4*  NEUTROABS 6.7 4.6 9.8*  HGB 11.1* 11.0* 10.6*  HCT 33.9* 34.3* 32.8*  MCV 101.5* 101.2* 101.9*  PLT 120* 131* 136*    Basic Metabolic Panel: Recent Labs  Lab 02/06/20 0811 02/07/20 0602 02/08/20 0603 02/09/20 0459 02/10/20 0412  NA 136 141 137 137 137  K 4.6 5.6* 4.4 4.1 4.9  CL 100 104 103 102 103  CO2 21* 24 23 22  21*  GLUCOSE 139* 101* 115* 127* 160*  BUN 43* 35* 32* 36* 35*  CREATININE 2.53* 1.89* 1.99* 2.01* 1.87*  CALCIUM 9.5 9.9 9.2 9.1 9.1  MG  --   --  1.8  --   --     GFR: Estimated Creatinine Clearance: 28.5 mL/min (A) (by C-G formula based on SCr of 1.87 mg/dL (H)).  Liver Function Tests: Recent Labs  Lab 02/06/20 0811 02/10/20 0412  AST 79* 83*  ALT 40 39  ALKPHOS 448* 376*  BILITOT 1.2 1.3*  PROT 8.0 7.3  ALBUMIN 3.7 2.9*    CBG: Recent Labs  Lab 02/09/20 2128 02/10/20 0332 02/10/20 0718 02/10/20 1117 02/10/20 1640  GLUCAP 162* 160* 155* 118* 150*    Recent Results (from the past 240 hour(s))  SARS Coronavirus 2 by RT PCR (hospital order, performed in [facility] hospital lab) Nasopharyngeal Nasopharyngeal Swab     Status: None   Collection Time: 02/06/20  8:10 AM   Specimen: Nasopharyngeal Swab  Result Value Ref Range Status   SARS Coronavirus 2 NEGATIVE NEGATIVE Final    Comment: (NOTE) SARS-CoV-2 target nucleic  acids are NOT DETECTED.  The SARS-CoV-2 RNA is generally detectable in upper and lower respiratory specimens during the acute phase of infection. The lowest concentration of SARS-CoV-2 viral copies this assay can detect is 250 copies / mL. A negative result does not preclude SARS-CoV-2 infection and should not be used as the sole basis for treatment or other patient management decisions.  A negative result may occur with improper specimen collection / handling, submission of specimen other than nasopharyngeal swab, presence of viral  mutation(s) within the areas targeted by this assay, and inadequate number of viral copies (<250 copies / mL). A negative result must be combined with clinical observations, patient history, and epidemiological information.  Fact Sheet for Patients:   StrictlyIdeas.no  Fact Sheet for Healthcare Providers: BankingDealers.co.za  This test is not yet approved or  cleared by the [place] FDA and has been authorized for detection and/or diagnosis of SARS-CoV-2 by FDA under an Emergency Use Authorization (EUA).  This EUA will remain in effect (meaning this test can be used) for the duration of the COVID-19 declaration under Section 564(b)(1) of the Act, 21 U.S.C. section 360bbb-3(b)(1), unless the authorization is terminated or revoked sooner.  Performed at [facility], [address], [place], [place] [address]   Urine Culture     Status: Abnormal   Collection Time: 02/06/20  9:27 AM   Specimen: Urine, Clean Catch  Result Value Ref Range Status   Specimen Description   Final    URINE, CLEAN CATCH Performed at [facility], [address] [place], [place] [address]    Special Requests   Final    NONE Performed at [facility], [address] [place], [place] [address]    Culture (A)  Final    <10,000 COLONIES/mL INSIGNIFICANT GROWTH Performed at [place] [address] [place],   [address]    Report Status 02/08/2020 FINAL  Final     Radiology Studies: US Abdomen Complete  Result Date: 02/10/2020 CLINICAL DATA:  Right upper quadrant abdominal pain. Cholelithiasis. EXAM: ABDOMEN ULTRASOUND COMPLETE COMPARISON:  Right upper quadrant ultrasound 1 week ago 02/03/2020 FINDINGS: Gallbladder: Gallbladder is difficult to visualize. Suspected gallbladder filled with stones creating a wall echo shadow complex with posterior shadowing. No sonographic Murphy sign noted by sonographer. Common bile duct: Diameter: 3-4 mm, normal. Liver: Heterogeneous in parenchymal echogenicity. Hyperechoic lesion measuring 1.2 cm is typical of hemangioma. The larger lesion on prior ultrasound is not well-defined on the current exam, currently blends with heterogeneous adjacent parenchyma. Portal vein is patent on color Doppler imaging with normal direction of blood flow towards the liver. IVC: No abnormality visualized. Pancreas: Visualized portion unremarkable. Spleen: Normal in size. Few scattered echogenic foci may represent granulomas. Right Kidney: Length: 8.5 cm. Echogenicity within normal limits. The lower pole is partially obscured by shadowing bowel gas. No mass or hydronephrosis visualized. Left Kidney: Length: 9.7 cm. Echogenicity within normal limits. Elise 2 cysts largest measuring 4 cm in the upper pole. No solid mass or hydronephrosis visualized. Abdominal aorta: No aneurysm visualized. Other findings: Trace ascites. Suspected small right pleural effusion. IMPRESSION: 1. Gallbladder is difficult to delineate, however may be contracted and filled with stones creating a wall echo shadow complex. No sonographic Murphy sign. If there is clinical concern for acute cholecystitis, consider further evaluation with nuclear medicine HIDA scan. 2. No biliary dilatation. 3. Heterogeneous hepatic parenchyma. Small hyperechoic lesion is likely hemangioma. The larger lesion on prior ultrasound is not well-defined  on the current exam and currently blends with adjacent heterogeneous parenchyma. This could be further evaluated with hepatic protocol MRI on an elective basis. 4. Trace abdominal ascites. Electronically Signed   By: Keith Rake M.D.   On: 02/10/2020 16:00   DG ABD ACUTE 2+V W 1V CHEST  Result Date: 02/10/2020 CLINICAL DATA:  Leukocytosis. EXAM: DG ABDOMEN ACUTE W/ 1V CHEST COMPARISON:  Chest radiograph February 06, 2020. FINDINGS: AP chest: Lungs are clear. Heart size and pulmonary vascularity are normal. No  adenopathy. Supine and upright abdomen: There is diffuse stool throughout the colon. There is no bowel dilatation or air-fluid level to suggest bowel obstruction. No free air. No abnormal calcifications. IMPRESSION: Diffuse stool throughout colon. No bowel obstruction or free air. Lungs clear. Electronically Signed   By: Lowella Grip III M.D.   On: 02/10/2020 08:32   Scheduled Meds: . acetaminophen  650 mg Oral Q6H   Or  . acetaminophen  650 mg Rectal Q6H  . amLODipine  5 mg Oral Daily  . aspirin EC  81 mg Oral Daily  . cholecalciferol  1,000 Units Oral q AM  . enoxaparin (LOVENOX) injection  30 mg Subcutaneous Q24H  . feeding supplement (NEPRO CARB STEADY)  237 mL Oral Q24H  . insulin aspart  0-9 Units Subcutaneous TID WC  . latanoprost  1 drop Both Eyes [id number]  . multivitamin with minerals  1 tablet Oral Daily  . pantoprazole (PROTONIX) IV  40 mg Intravenous Q24H  . senna-docusate  1 tablet Oral BID   Continuous Infusions: . sodium chloride 60 mL/hr at 02/09/20 1827     LOS: 4 days   Time spent: 15 mins    Erienne Spelman Wynetta Emery, MD How to contact the [facility] Attending or Consulting provider [place] or covering provider during after hours [place], for this patient?  1. Check the care team in [facility] and look for a) attending/consulting TR[facility] provider listed and b) the [facility] team listed 2. Log into [email or web address] and use 's universal password to access. If you do not have the  password, please contact the hospital operator. 3. Locate the [facility] provider you are looking for under [facility] and page to a number that you can be directly reached. 4. If you still have difficulty reaching the provider, please page the [facility] (Director on Call) for the Hospitalists listed on amion for assistance.  02/10/2020, 7:14 PM

## 2020-02-10 NOTE — TOC Progression Note (Signed)
Transition of Care [facility]) - Progression Note    Patient Details  Name: RAYVON DAKIN MRN: [id number] Date of Birth: 04/01/1929  [facility]) CM/SW Contact  Ihor Gully, LCSW Phone Number: 02/10/2020, 4:38 PM  Clinical Narrative:    Referrals sent to additional facilities at patient's family request. Previous facility of acceptance/selection is in outbreak status and no longer and option for family.  Family is considering options.   Expected Discharge Plan: [place] Barriers to Discharge: Continued Medical Work up  Expected Discharge Plan and Services Expected Discharge Plan: [place] In-house Referral: Clinical Social Work Discharge Planning Services: NA Post Acute Care Choice: [place] Living arrangements for the past 2 months: Single Family Home                                       Social Determinants of Health (SDOH) Interventions    Readmission Risk Interventions No flowsheet data found.

## 2020-02-11 ENCOUNTER — Inpatient Hospital Stay (HOSPITAL_COMMUNITY): Payer: Medicare Other

## 2020-02-11 DIAGNOSIS — K8020 Calculus of gallbladder without cholecystitis without obstruction: Secondary | ICD-10-CM

## 2020-02-11 DIAGNOSIS — R1011 Right upper quadrant pain: Secondary | ICD-10-CM

## 2020-02-11 DIAGNOSIS — N179 Acute kidney failure, unspecified: Secondary | ICD-10-CM | POA: Diagnosis not present

## 2020-02-11 LAB — CBC WITH DIFFERENTIAL/PLATELET
Abs Immature Granulocytes: 0.05 10*3/uL (ref 0.00–0.07)
Basophils Absolute: 0 10*3/uL (ref 0.0–0.1)
Basophils Relative: 0 %
Eosinophils Absolute: 0 10*3/uL (ref 0.0–0.5)
Eosinophils Relative: 0 %
HCT: 33.6 % — ABNORMAL LOW (ref 39.0–52.0)
Hemoglobin: 10.9 g/dL — ABNORMAL LOW (ref 13.0–17.0)
Immature Granulocytes: 1 %
Lymphocytes Relative: 7 %
Lymphs Abs: 0.8 10*3/uL (ref 0.7–4.0)
MCH: 32.4 pg (ref 26.0–34.0)
MCHC: 32.4 g/dL (ref 30.0–36.0)
MCV: 100 fL (ref 80.0–100.0)
Monocytes Absolute: 0.6 10*3/uL (ref 0.1–1.0)
Monocytes Relative: 6 %
Neutro Abs: 9.6 10*3/uL — ABNORMAL HIGH (ref 1.7–7.7)
Neutrophils Relative %: 86 %
Platelets: 152 10*3/uL (ref 150–400)
RBC: 3.36 MIL/uL — ABNORMAL LOW (ref 4.22–5.81)
RDW: 13.2 % (ref 11.5–15.5)
WBC: 11.1 10*3/uL — ABNORMAL HIGH (ref 4.0–10.5)
nRBC: 0 % (ref 0.0–0.2)

## 2020-02-11 LAB — COMPREHENSIVE METABOLIC PANEL
ALT: 36 U/L (ref 0–44)
AST: 63 U/L — ABNORMAL HIGH (ref 15–41)
Albumin: 2.8 g/dL — ABNORMAL LOW (ref 3.5–5.0)
Alkaline Phosphatase: 328 U/L — ABNORMAL HIGH (ref 38–126)
Anion gap: 13 (ref 5–15)
BUN: 35 mg/dL — ABNORMAL HIGH (ref 8–23)
CO2: 24 mmol/L (ref 22–32)
Calcium: 9.3 mg/dL (ref 8.9–10.3)
Chloride: 99 mmol/L (ref 98–111)
Creatinine, Ser: 1.65 mg/dL — ABNORMAL HIGH (ref 0.61–1.24)
GFR calc Af Amer: 42 mL/min — ABNORMAL LOW (ref >60)
GFR calc non Af Amer: 36 mL/min — ABNORMAL LOW (ref >60)
Glucose, Bld: 158 mg/dL — ABNORMAL HIGH (ref 70–99)
Potassium: 4 mmol/L (ref 3.5–5.1)
Sodium: 136 mmol/L (ref 135–145)
Total Bilirubin: 1.1 mg/dL (ref 0.3–1.2)
Total Protein: 7.3 g/dL (ref 6.5–8.1)

## 2020-02-11 LAB — GLUCOSE, CAPILLARY
Glucose-Capillary: 150 mg/dL — ABNORMAL HIGH (ref 70–99)
Glucose-Capillary: 142 mg/dL — ABNORMAL HIGH (ref 70–99)
Glucose-Capillary: 132 mg/dL — ABNORMAL HIGH (ref 70–99)
Glucose-Capillary: 141 mg/dL — ABNORMAL HIGH (ref 70–99)
Glucose-Capillary: 141 mg/dL — ABNORMAL HIGH (ref 70–99)

## 2020-02-11 LAB — MAGNESIUM: Magnesium: 1.9 mg/dL (ref 1.7–2.4)

## 2020-02-11 MED ORDER — GADOBUTROL 1 MMOL/ML IV SOLN
7.0000 mL | Freq: Once | INTRAVENOUS | Status: AC | PRN
  Administered 2020-02-11: 7 mL via INTRAVENOUS

## 2020-02-11 MED ORDER — BOOST / RESOURCE BREEZE PO LIQD CUSTOM
1.0000 | Freq: Two times a day (BID) | ORAL | Status: DC
  Administered 2020-02-11 – 2020-02-12 (×3): 1 via ORAL

## 2020-02-11 NOTE — Consult Note (Signed)
[facility] Consult  Reason for Consult: Cholelithiasis, abdominal pain  Referring Physician:  Dr. Manuella Ghazi  Chief Complaint    Weakness      HPI: Jerome Chambers is a 86 y.o. male with a history of dementia CKD 3, DM, OSA on CPAP, HTN who was admitted with AKI and UTI and has been in the hospital since 7/23 getting antibiotics. He was being worked up for malaise and abdominal pain by his PCP, Dr. Legrand Rams would was questioning gallbladder etiology. A [place] was done prior to his admission that demonstrated incomplete visualization of the gallbladder and a question of hemangiomas with MRI recommendations.  [place] was repeated as an inpatient due to continued poor intake and abdominal pain with transaminitis and demonstrates contracted gallbladder with stones but no definitive cholecystitis and again concern for hemangioma and recommendations for MRI.   I spoke with his wife, Ms. Hanawalt, and she reports a few weeks ago he was very restless and agitated and had abdominal pain.  She says he had not fasted and the [place] was incomplete for this reason.  He has been drinking but not really eating. He has been urinating and she says he had a BM yesterday but had been constipated prior to that time.  He had fallen trying to getting up to go the restroom and this prompted to visit to the ED.  I year ago he had a similar episode of abdominal pain and was in the hospital for 2 days.  He has never had surgery or cardiac issues.   Past Medical History:  Diagnosis Date  . Allergic rhinitis, cause unspecified   . Dementia ([place])   . Diabetes ([place])   . Diverticulosis   . DJD (degenerative joint disease)   . Glaucoma   . History of Helicobacter pylori infection   . Sleep apnea    NO CPAP MACHINE   . Unspecified essential hypertension     History reviewed. No pertinent surgical history.  Family History  Problem Relation Age of Onset  . Colon cancer Neg Hx     Social History   Tobacco Use  . Smoking  status: Never Smoker  . Smokeless tobacco: Never Used  Vaping Use  . Vaping Use: Never used  Substance Use Topics  . Alcohol use: No  . Drug use: No    Medications:  I have reviewed the patient's current medications. Prior to Admission:  Medications Prior to Admission  Medication Sig Dispense Refill Last Dose  . amLODipine (NORVASC) 2.5 MG tablet Take 2.5 mg by mouth daily.   02/05/2020 at 0900  . aspirin 81 MG tablet Take 81 mg by mouth daily.   02/05/2020 at 0900  . Cholecalciferol (VITAMIN D3) 25 MCG (1000 UT) CAPS Take 1 capsule by mouth in the morning.   02/05/2020 at 0900  . econazole nitrate 1 % cream Apply 1 application topically daily as needed (toe).   Past Week at Unknown time  . ferrous gluconate (FERGON) 324 MG tablet Take 324 mg by mouth daily with breakfast.   02/05/2020 at 0830  . glimepiride (AMARYL) 2 MG tablet Take 2 mg by mouth daily with breakfast.   02/05/2020 at 0900  . latanoprost (XALATAN) 0.005 % ophthalmic solution Place 1 drop into both eyes every morning.   02/05/2020 at 0900  . Multiple Vitamin (MULTIVITAMIN WITH MINERALS) TABS tablet Take 1 tablet by mouth daily.   02/05/2020 at Unknown time   Scheduled: . acetaminophen  650 mg Oral Q6H  Or  . acetaminophen  650 mg Rectal Q6H  . amLODipine  5 mg Oral Daily  . aspirin EC  81 mg Oral Daily  . cholecalciferol  1,000 Units Oral q AM  . enoxaparin (LOVENOX) injection  30 mg Subcutaneous Q24H  . feeding supplement  1 Container Oral BID BM  . insulin aspart  0-9 Units Subcutaneous TID WC  . latanoprost  1 drop Both Eyes [id number]  . multivitamin with minerals  1 tablet Oral Daily  . pantoprazole (PROTONIX) IV  40 mg Intravenous Q24H  . senna-docusate  1 tablet Oral BID   Continuous: . sodium chloride 60 mL/hr at 02/09/20 1827   JJH:ERDE & mag hydroxide-simeth, bisacodyl, fentaNYL (SUBLIMAZE) injection, metoprolol tartrate, ondansetron **OR** ondansetron (ZOFRAN) IV, terbinafine, traZODone  No Known  Allergies   ROS:  Review of systems not obtained due to patient factors.  Dementia but says he is having abdominal pain, hiccupping some   Blood pressure (!) 144/81, pulse 76, temperature 97.8 F (36.6 C), resp. rate 17, height 5\' 11"  (1.803 m), weight 77 kg, SpO2 100 %. Physical Exam Vitals reviewed.  Constitutional:      Appearance: Normal appearance.  HENT:     Head: Normocephalic and atraumatic.     Nose: Nose normal.     Mouth/Throat:     Mouth: Mucous membranes are moist.  Eyes:     Extraocular Movements: Extraocular movements intact.     Pupils: Pupils are equal, round, and reactive to light.  Cardiovascular:     Rate and Rhythm: Normal rate and regular rhythm.  Pulmonary:     Effort: Pulmonary effort is normal.     Breath sounds: Normal breath sounds.  Abdominal:     General: There is distension.     Palpations: Abdomen is soft.     Tenderness: There is abdominal tenderness. There is no guarding or rebound.     Comments: Epigastric tenderness  Musculoskeletal:        General: No swelling. Normal range of motion.     Cervical back: Normal range of motion.  Skin:    General: Skin is warm and dry.  Neurological:     Mental Status: He is alert. Mental status is at baseline. He is disoriented.     Comments: Oriented to self only   Psychiatric:        Mood and Affect: Mood normal.        Behavior: Behavior normal.        Thought Content: Thought content normal.        Judgment: Judgment normal.     Results: Results for orders placed or performed during the hospital encounter of 02/06/20 (from the past 48 hour(s))  Glucose, capillary     Status: Abnormal   Collection Time: 02/09/20  4:16 PM  Result Value Ref Range   Glucose-Capillary 166 (H) 70 - 99 mg/dL    Comment: Glucose reference range applies only to samples taken after fasting for at least 8 hours.  Glucose, capillary     Status: Abnormal   Collection Time: 02/09/20  9:28 PM  Result Value Ref Range    Glucose-Capillary 162 (H) 70 - 99 mg/dL    Comment: Glucose reference range applies only to samples taken after fasting for at least 8 hours.  Glucose, capillary     Status: Abnormal   Collection Time: 02/10/20  3:32 AM  Result Value Ref Range   Glucose-Capillary 160 (H) 70 - 99 mg/dL  Comment: Glucose reference range applies only to samples taken after fasting for at least 8 hours.  CBC with Differential/Platelet     Status: Abnormal   Collection Time: 02/10/20  4:12 AM  Result Value Ref Range   WBC 11.4 (H) 4.0 - 10.5 K/uL   RBC 3.22 (L) 4.22 - 5.81 MIL/uL   Hemoglobin 10.6 (L) 13.0 - 17.0 g/dL   HCT 32.8 (L) 39 - 52 %   MCV 101.9 (H) 80.0 - 100.0 fL   MCH 32.9 26.0 - 34.0 pg   MCHC 32.3 30.0 - 36.0 g/dL   RDW 13.3 11.5 - 15.5 %   Platelets 136 (L) 150 - 400 K/uL   nRBC 0.0 0.0 - 0.2 %   Neutrophils Relative % 87 %   Neutro Abs 9.8 (H) 1.7 - 7.7 K/uL   Lymphocytes Relative 7 %   Lymphs Abs 0.8 0.7 - 4.0 K/uL   Monocytes Relative 6 %   Monocytes Absolute 0.7 0 - 1 K/uL   Eosinophils Relative 0 %   Eosinophils Absolute 0.0 0 - 0 K/uL   Basophils Relative 0 %   Basophils Absolute 0.0 0 - 0 K/uL   Immature Granulocytes 0 %   Abs Immature Granulocytes 0.04 0.00 - 0.07 K/uL    Comment: Performed at [facility], [address] [place], [place] [address]  Comprehensive metabolic panel     Status: Abnormal   Collection Time: 02/10/20  4:12 AM  Result Value Ref Range   Sodium 137 135 - 145 mmol/L   Potassium 4.9 3.5 - 5.1 mmol/L   Chloride 103 98 - 111 mmol/L   CO2 21 (L) 22 - 32 mmol/L   Glucose, Bld 160 (H) 70 - 99 mg/dL    Comment: Glucose reference range applies only to samples taken after fasting for at least 8 hours.   BUN 35 (H) 8 - 23 mg/dL   Creatinine, Ser 1.87 (H) 0.61 - 1.24 mg/dL   Calcium 9.1 8.9 - 10.3 mg/dL   Total Protein 7.3 6.5 - 8.1 g/dL   Albumin 2.9 (L) 3.5 - 5.0 g/dL   AST 83 (H) 15 - 41 U/L   ALT 39 0 - 44 U/L   Alkaline Phosphatase 376 (H) 38 - 126  U/L   Total Bilirubin 1.3 (H) 0.3 - 1.2 mg/dL   GFR calc non Af Amer 31 (L) >60 mL/min   GFR calc Af Amer 36 (L) >60 mL/min   Anion gap 13 5 - 15    Comment: Performed at [facility], [address], [place], [place] [address]  Glucose, capillary     Status: Abnormal   Collection Time: 02/10/20  7:18 AM  Result Value Ref Range   Glucose-Capillary 155 (H) 70 - 99 mg/dL    Comment: Glucose reference range applies only to samples taken after fasting for at least 8 hours.  Glucose, capillary     Status: Abnormal   Collection Time: 02/10/20 11:17 AM  Result Value Ref Range   Glucose-Capillary 118 (H) 70 - 99 mg/dL    Comment: Glucose reference range applies only to samples taken after fasting for at least 8 hours.  Glucose, capillary     Status: Abnormal   Collection Time: 02/10/20  4:40 PM  Result Value Ref Range   Glucose-Capillary 150 (H) 70 - 99 mg/dL    Comment: Glucose reference range applies only to samples taken after fasting for at least 8 hours.  Glucose, capillary  Status: Abnormal   Collection Time: 02/10/20 10:11 PM  Result Value Ref Range   Glucose-Capillary 133 (H) 70 - 99 mg/dL    Comment: Glucose reference range applies only to samples taken after fasting for at least 8 hours.   Comment 1 Notify RN    Comment 2 Document in Chart   Glucose, capillary     Status: Abnormal   Collection Time: 02/11/20  3:08 AM  Result Value Ref Range   Glucose-Capillary 150 (H) 70 - 99 mg/dL    Comment: Glucose reference range applies only to samples taken after fasting for at least 8 hours.  Comprehensive metabolic panel     Status: Abnormal   Collection Time: 02/11/20  4:11 AM  Result Value Ref Range   Sodium 136 135 - 145 mmol/L   Potassium 4.0 3.5 - 5.1 mmol/L    Comment: DELTA CHECK NOTED   Chloride 99 98 - 111 mmol/L   CO2 24 22 - 32 mmol/L   Glucose, Bld 158 (H) 70 - 99 mg/dL    Comment: Glucose reference range applies only to samples taken after fasting for at least 8  hours.   BUN 35 (H) 8 - 23 mg/dL   Creatinine, Ser 1.65 (H) 0.61 - 1.24 mg/dL   Calcium 9.3 8.9 - 10.3 mg/dL   Total Protein 7.3 6.5 - 8.1 g/dL   Albumin 2.8 (L) 3.5 - 5.0 g/dL   AST 63 (H) 15 - 41 U/L   ALT 36 0 - 44 U/L   Alkaline Phosphatase 328 (H) 38 - 126 U/L   Total Bilirubin 1.1 0.3 - 1.2 mg/dL   GFR calc non Af Amer 36 (L) >60 mL/min   GFR calc Af Amer 42 (L) >60 mL/min   Anion gap 13 5 - 15    Comment: Performed at [facility], [address], [place], [place] [address]  CBC with Differential/Platelet     Status: Abnormal   Collection Time: 02/11/20  4:11 AM  Result Value Ref Range   WBC 11.1 (H) 4.0 - 10.5 K/uL   RBC 3.36 (L) 4.22 - 5.81 MIL/uL   Hemoglobin 10.9 (L) 13.0 - 17.0 g/dL   HCT 33.6 (L) 39 - 52 %   MCV 100.0 80.0 - 100.0 fL   MCH 32.4 26.0 - 34.0 pg   MCHC 32.4 30.0 - 36.0 g/dL   RDW 13.2 11.5 - 15.5 %   Platelets 152 150 - 400 K/uL   nRBC 0.0 0.0 - 0.2 %   Neutrophils Relative % 86 %   Neutro Abs 9.6 (H) 1.7 - 7.7 K/uL   Lymphocytes Relative 7 %   Lymphs Abs 0.8 0.7 - 4.0 K/uL   Monocytes Relative 6 %   Monocytes Absolute 0.6 0 - 1 K/uL   Eosinophils Relative 0 %   Eosinophils Absolute 0.0 0 - 0 K/uL   Basophils Relative 0 %   Basophils Absolute 0.0 0 - 0 K/uL   Immature Granulocytes 1 %   Abs Immature Granulocytes 0.05 0.00 - 0.07 K/uL    Comment: Performed at [facility], [address] [place], [place] [address]  Magnesium     Status: None   Collection Time: 02/11/20  4:11 AM  Result Value Ref Range   Magnesium 1.9 1.7 - 2.4 mg/dL    Comment: Performed at [facility], [address], [place], [place] [address]  Glucose, capillary     Status: Abnormal   Collection Time: 02/11/20  8:02 AM  Result Value Ref Range   Glucose-Capillary 142 (H) 70 - 99 mg/dL    Comment: Glucose reference range applies only to samples taken after fasting for at least 8 hours.  Glucose, capillary     Status: Abnormal   Collection Time: 02/11/20 11:34 AM  Result  Value Ref Range   Glucose-Capillary 132 (H) 70 - 99 mg/dL    Comment: Glucose reference range applies only to samples taken after fasting for at least 8 hours.   Personally reviewed Xray with distended stomach and stool burden in colon, gallbladder with stones contracted and areas in the liver concerning for hemangiomas noted  US Abdomen Complete  Result Date: 02/10/2020 CLINICAL DATA:  Right upper quadrant abdominal pain. Cholelithiasis. EXAM: ABDOMEN ULTRASOUND COMPLETE COMPARISON:  Right upper quadrant ultrasound 1 week ago 02/03/2020 FINDINGS: Gallbladder: Gallbladder is difficult to visualize. Suspected gallbladder filled with stones creating a wall echo shadow complex with posterior shadowing. No sonographic Murphy sign noted by sonographer. Common bile duct: Diameter: 3-4 mm, normal. Liver: Heterogeneous in parenchymal echogenicity. Hyperechoic lesion measuring 1.2 cm is typical of hemangioma. The larger lesion on prior ultrasound is not well-defined on the current exam, currently blends with heterogeneous adjacent parenchyma. Portal vein is patent on color Doppler imaging with normal direction of blood flow towards the liver. IVC: No abnormality visualized. Pancreas: Visualized portion unremarkable. Spleen: Normal in size. Few scattered echogenic foci may represent granulomas. Right Kidney: Length: 8.5 cm. Echogenicity within normal limits. The lower pole is partially obscured by shadowing bowel gas. No mass or hydronephrosis visualized. Left Kidney: Length: 9.7 cm. Echogenicity within normal limits. Elise 2 cysts largest measuring 4 cm in the upper pole. No solid mass or hydronephrosis visualized. Abdominal aorta: No aneurysm visualized. Other findings: Trace ascites. Suspected small right pleural effusion. IMPRESSION: 1. Gallbladder is difficult to delineate, however may be contracted and filled with stones creating a wall echo shadow complex. No sonographic Murphy sign. If there is clinical concern  for acute cholecystitis, consider further evaluation with nuclear medicine HIDA scan. 2. No biliary dilatation. 3. Heterogeneous hepatic parenchyma. Small hyperechoic lesion is likely hemangioma. The larger lesion on prior ultrasound is not well-defined on the current exam and currently blends with adjacent heterogeneous parenchyma. This could be further evaluated with hepatic protocol MRI on an elective basis. 4. Trace abdominal ascites. Electronically Signed   By: Keith Rake M.D.   On: 02/10/2020 16:00   DG ABD ACUTE 2+V W 1V CHEST  Result Date: 02/10/2020 CLINICAL DATA:  Leukocytosis. EXAM: DG ABDOMEN ACUTE W/ 1V CHEST COMPARISON:  Chest radiograph February 06, 2020. FINDINGS: AP chest: Lungs are clear. Heart size and pulmonary vascularity are normal. No adenopathy. Supine and upright abdomen: There is diffuse stool throughout the colon. There is no bowel dilatation or air-fluid level to suggest bowel obstruction. No free air. No abnormal calcifications. IMPRESSION: Diffuse stool throughout colon. No bowel obstruction or free air. Lungs clear. Electronically Signed   By: Lowella Grip III M.D.   On: 02/10/2020 08:32     Assessment & Plan:  HAWKIN CHARO is a 86 y.o. male with contracted gallbladder and stones on [place] with concern for hemangioma. I asked Dr. Manuella Ghazi to complete a MRI to evaluate this further prior to discussion of the cholecystectomy.  He is tender and based on his wife's description I think he likely has some issues with this gallbladder. Interestingly his stomach is very distended too on his Xray from yesterday.  He could also have some  degree of gastroparesis given this diabetes.    -MRI liver protocol to further evaluate hemangioma and determine if this will preclude surgery if large or in close proximity  -Discussed with wife and will see in the AM and discuss further -May need additional imaging pending MRI   All questions were answered to the satisfaction of the  wife.  Virl Cagey 02/11/2020, 2:32 PM

## 2020-02-11 NOTE — Progress Notes (Signed)
Patient not using CPAP, unit pulled out of room at this time.

## 2020-02-11 NOTE — Care Management Important Message (Signed)
Important Message  Patient Details  Name: Jerome Chambers MRN: [id number] Date of Birth: [birth year]   Medicare Important Message Given:  Yes     Tommy Medal 02/11/2020, 2:04 PM

## 2020-02-11 NOTE — Progress Notes (Addendum)
PROGRESS NOTE    Jerome Chambers  QHU:[id number] DOB: [birth year] DOA: 02/06/2020 PCP: Rosita Fire, MD   Brief Narrative:  86 y.o.malewith medical history significant ofdementia, stage 3b CKD, type 2 diabetes mellitus, OSA not on CPAP, HTN, glaucoma whohas been having symptoms of weakness and malaise over the past week. He has been working with his primary care physician and they have been evaluating him for gallbladder disease. He had an episode yesterday where he stood up from a seated position and passed out. He fell on his right arm. He was unable to stand up after that. His wife had to get assistance to get him up off the floor. He had complained of some right arm pain other wise he had no complaints. He is still having difficulty ambulating. His wife reports that he has been fully vaccinated for COVID-19.  -Patient complaining of abdominal pain according to wife at bedside.  He is tolerating very little of his diet and is noted to have some nausea.  Assessment & Plan:   Principal Problem:   AKI (acute kidney injury) ([place]) Active Problems:   Unspecified glaucoma   Essential hypertension   Allergic rhinitis   Osteoarthritis   Senile dementia ([place])   Type 2 diabetes mellitus without complication (HCC)   Acute renal failure superimposed on stage 3 chronic kidney disease (HCC)   UTI (urinary tract infection)   Cystitis   Thrombocytopenia (HCC)   Anemia in chronic kidney disease (CKD)   Gait instability   At risk for falling   1. AKI on stage 3b CKD - improving, suspect prerenal as he is clinically dehydrated and wife confirms poor oral intake over last 2 days prior to admission. Treating with gentle IV fluids and recheck BMP in AM. Hold nephrotoxic agents and renally dose meds as needed.  Continue IV fluid. 2. Abdominal pain with cholelithiasis.  Continues to have ongoing transaminitis and poor dietary tolerance.  Abdominal ultrasound performed 7/27 with recommendation for  MRI.  Discussed with general surgery with recommendation for MRI today and further evaluation pending. 3. Cystitis - TREATED.  Pt presented with urinary findings of acute cystitis. Continue ceftriaxone IV and follow urine culture results.  UC insignificant growth.  4. Thrombocytopenia - improved, platelets up to 152.  Repeat CBC in a.m.Type 2 diabetes mellitus - SSI coverage and CBG testing ordered.  BS controlled.  5. Dementia - delirium precautions, family planning to stay with him as much as possible.  6. Gait instability / generalized weakness - PT evaluation recommending SNF placement, TOC consulted to assist with placement.  Still awaiting bed placement.  7. Right arm pain from fall - xray negative for fracture.  Tylenol as needed for pain.  8. HTN - resume home meds and follow. 9. Constipation - added peri-colace BID and given suppository.   DVT prophylaxis:lovenox Code Status:full Family Communication:Wife updated at bedside7/28 Disposition Plan:wife agreeable to SNF rehab Consults called:PT Admission status:INP Status is: Inpatient  Remains inpatient appropriate because:IV treatments appropriate due to intensity of illness or inability to take PO and Inpatient level of care appropriate due to severity of illness.  Awaiting SNF bed placement, but also requiring general surgery evaluation today.   Dispo: The patient is from: Home  Anticipated d/c is to: SNF  Anticipated d/c date is: 1-2 days   Patient is currently not medically stable for discharge and will require general surgery evaluation.  Consultants:   General surgery-Dr. Constance Haw  Procedures:   See below  Antimicrobials:  Anti-infectives (From admission, onward)   Start     Dose/Rate Route Frequency Ordered Stop   02/07/20 0900  cefTRIAXone (ROCEPHIN) 1 g in sodium chloride 0.9 % 100 mL IVPB        1 g 200 mL/hr over 30 Minutes Intravenous Every 24 hours  02/06/20 2043 02/08/20 0913   02/06/20 0930  cefTRIAXone (ROCEPHIN) 1 g in sodium chloride 0.9 % 100 mL IVPB        1 g 200 mL/hr over 30 Minutes Intravenous  Once 02/06/20 0926 02/06/20 1030       Subjective: Patient seen and evaluated today with noted concerns of ongoing abdominal pain and poor dietary tolerance according to wife at bedside.  No nausea or vomiting noted.  No acute concerns or events noted overnight.  Objective: Vitals:   02/10/20 0529 02/10/20 1536 02/10/20 2209 02/11/20 0438  BP: (!) 137/77 (!) 146/91 (!) 136/83 (!) 144/81  Pulse: 80 81 77 76  Resp: 17 18 20 17   Temp: 98 F (36.7 C) 98.4 F (36.9 C) 98.3 F (36.8 C) 97.8 F (36.6 C)  TempSrc: Oral Oral Oral   SpO2: 100% 100% 100% 100%  Weight:      Height:        Intake/Output Summary (Last 24 hours) at 02/11/2020 1132 Last data filed at 02/11/2020 0452 Gross per 24 hour  Intake 120 ml  Output 1500 ml  Net -1380 ml   Filed Weights   02/06/20 0630 02/06/20 2043  Weight: 86 kg 77 kg    Examination:  General exam: Appears calm and comfortable  Respiratory system: Clear to auscultation. Respiratory effort normal. Cardiovascular system: S1 & S2 heard, RRR. No JVD, murmurs, rubs, gallops or clicks. No pedal edema. Gastrointestinal system: Abdomen is soft, with no significant tenderness appreciated. Central nervous system: Alert and awake.  Appears pleasantly confused. Extremities: No edema Skin: No rashes, lesions or ulcers Psychiatry: Judgement and insight appear normal. Mood & affect appropriate.     Data Reviewed: I have personally reviewed following labs and imaging studies  CBC: Recent Labs  Lab 02/06/20 0811 02/07/20 0602 02/10/20 0412 02/11/20 0411  WBC 7.8 6.4 11.4* 11.1*  NEUTROABS 6.7 4.6 9.8* 9.6*  HGB 11.1* 11.0* 10.6* 10.9*  HCT 33.9* 34.3* 32.8* 33.6*  MCV 101.5* 101.2* 101.9* 100.0  PLT 120* 131* 136* 322   Basic Metabolic Panel: Recent Labs  Lab 02/07/20 0602  02/08/20 0603 02/09/20 0459 02/10/20 0412 02/11/20 0411  NA 141 137 137 137 136  K 5.6* 4.4 4.1 4.9 4.0  CL 104 103 102 103 99  CO2 24 23 22  21* 24  GLUCOSE 101* 115* 127* 160* 158*  BUN 35* 32* 36* 35* 35*  CREATININE 1.89* 1.99* 2.01* 1.87* 1.65*  CALCIUM 9.9 9.2 9.1 9.1 9.3  MG  --  1.8  --   --  1.9   GFR: Estimated Creatinine Clearance: 32.3 mL/min (A) (by C-G formula based on SCr of 1.65 mg/dL (H)). Liver Function Tests: Recent Labs  Lab 02/06/20 0811 02/10/20 0412 02/11/20 0411  AST 79* 83* 63*  ALT 40 39 36  ALKPHOS 448* 376* 328*  BILITOT 1.2 1.3* 1.1  PROT 8.0 7.3 7.3  ALBUMIN 3.7 2.9* 2.8*   No results for input(s): LIPASE, AMYLASE in the last 168 hours. No results for input(s): AMMONIA in the last 168 hours. Coagulation Profile: Recent Labs  Lab 02/07/20 0602  INR 1.3*   Cardiac Enzymes: No results for input(s): CKTOTAL, CKMB, CKMBINDEX, TROPONINI  in the last 168 hours. BNP (last 3 results) No results for input(s): PROBNP in the last 8760 hours. HbA1C: No results for input(s): HGBA1C in the last 72 hours. CBG: Recent Labs  Lab 02/10/20 1117 02/10/20 1640 02/10/20 2211 02/11/20 0308 02/11/20 0802  GLUCAP 118* 150* 133* 150* 142*   Lipid Profile: No results for input(s): CHOL, HDL, LDLCALC, TRIG, CHOLHDL, LDLDIRECT in the last 72 hours. Thyroid Function Tests: No results for input(s): TSH, T4TOTAL, FREET4, T3FREE, THYROIDAB in the last 72 hours. Anemia Panel: No results for input(s): VITAMINB12, FOLATE, FERRITIN, TIBC, IRON, RETICCTPCT in the last 72 hours. Sepsis Labs: No results for input(s): PROCALCITON, LATICACIDVEN in the last 168 hours.  Recent Results (from the past 240 hour(s))  SARS Coronavirus 2 by RT PCR (hospital order, performed in [facility] hospital lab) Nasopharyngeal Nasopharyngeal Swab     Status: None   Collection Time: 02/06/20  8:10 AM   Specimen: Nasopharyngeal Swab  Result Value Ref Range Status   SARS Coronavirus 2  NEGATIVE NEGATIVE Final    Comment: (NOTE) SARS-CoV-2 target nucleic acids are NOT DETECTED.  The SARS-CoV-2 RNA is generally detectable in upper and lower respiratory specimens during the acute phase of infection. The lowest concentration of SARS-CoV-2 viral copies this assay can detect is 250 copies / mL. A negative result does not preclude SARS-CoV-2 infection and should not be used as the sole basis for treatment or other patient management decisions.  A negative result may occur with improper specimen collection / handling, submission of specimen other than nasopharyngeal swab, presence of viral mutation(s) within the areas targeted by this assay, and inadequate number of viral copies (<250 copies / mL). A negative result must be combined with clinical observations, patient history, and epidemiological information.  Fact Sheet for Patients:   StrictlyIdeas.no  Fact Sheet for Healthcare Providers: BankingDealers.co.za  This test is not yet approved or  cleared by the [place] FDA and has been authorized for detection and/or diagnosis of SARS-CoV-2 by FDA under an Emergency Use Authorization (EUA).  This EUA will remain in effect (meaning this test can be used) for the duration of the COVID-19 declaration under Section 564(b)(1) of the Act, 21 U.S.C. section 360bbb-3(b)(1), unless the authorization is terminated or revoked sooner.  Performed at [facility], [address] [place], [place] [address]   Urine Culture     Status: Abnormal   Collection Time: 02/06/20  9:27 AM   Specimen: Urine, Clean Catch  Result Value Ref Range Status   Specimen Description   Final    URINE, CLEAN CATCH Performed at [facility], [address], [place], [place] [address]    Special Requests   Final    NONE Performed at [facility], [address], [place], [place] [address]    Culture (A)  Final    <10,000 COLONIES/mL INSIGNIFICANT  GROWTH Performed at [facility] [address] [place], [place] [address]    Report Status 02/08/2020 FINAL  Final         Radiology Studies: US Abdomen Complete  Result Date: 02/10/2020 CLINICAL DATA:  Right upper quadrant abdominal pain. Cholelithiasis. EXAM: ABDOMEN ULTRASOUND COMPLETE COMPARISON:  Right upper quadrant ultrasound 1 week ago 02/03/2020 FINDINGS: Gallbladder: Gallbladder is difficult to visualize. Suspected gallbladder filled with stones creating a wall echo shadow complex with posterior shadowing. No sonographic Murphy sign noted by sonographer. Common bile duct: Diameter: 3-4 mm, normal. Liver: Heterogeneous in parenchymal echogenicity. Hyperechoic lesion measuring 1.2 cm  is typical of hemangioma. The larger lesion on prior ultrasound is not well-defined on the current exam, currently blends with heterogeneous adjacent parenchyma. Portal vein is patent on color Doppler imaging with normal direction of blood flow towards the liver. IVC: No abnormality visualized. Pancreas: Visualized portion unremarkable. Spleen: Normal in size. Few scattered echogenic foci may represent granulomas. Right Kidney: Length: 8.5 cm. Echogenicity within normal limits. The lower pole is partially obscured by shadowing bowel gas. No mass or hydronephrosis visualized. Left Kidney: Length: 9.7 cm. Echogenicity within normal limits. Elise 2 cysts largest measuring 4 cm in the upper pole. No solid mass or hydronephrosis visualized. Abdominal aorta: No aneurysm visualized. Other findings: Trace ascites. Suspected small right pleural effusion. IMPRESSION: 1. Gallbladder is difficult to delineate, however may be contracted and filled with stones creating a wall echo shadow complex. No sonographic Murphy sign. If there is clinical concern for acute cholecystitis, consider further evaluation with nuclear medicine HIDA scan. 2. No biliary dilatation. 3. Heterogeneous hepatic parenchyma. Small hyperechoic  lesion is likely hemangioma. The larger lesion on prior ultrasound is not well-defined on the current exam and currently blends with adjacent heterogeneous parenchyma. This could be further evaluated with hepatic protocol MRI on an elective basis. 4. Trace abdominal ascites. Electronically Signed   By: Keith Rake M.D.   On: 02/10/2020 16:00   DG ABD ACUTE 2+V W 1V CHEST  Result Date: 02/10/2020 CLINICAL DATA:  Leukocytosis. EXAM: DG ABDOMEN ACUTE W/ 1V CHEST COMPARISON:  Chest radiograph February 06, 2020. FINDINGS: AP chest: Lungs are clear. Heart size and pulmonary vascularity are normal. No adenopathy. Supine and upright abdomen: There is diffuse stool throughout the colon. There is no bowel dilatation or air-fluid level to suggest bowel obstruction. No free air. No abnormal calcifications. IMPRESSION: Diffuse stool throughout colon. No bowel obstruction or free air. Lungs clear. Electronically Signed   By: Lowella Grip III M.D.   On: 02/10/2020 08:32        Scheduled Meds: . acetaminophen  650 mg Oral Q6H   Or  . acetaminophen  650 mg Rectal Q6H  . amLODipine  5 mg Oral Daily  . aspirin EC  81 mg Oral Daily  . cholecalciferol  1,000 Units Oral q AM  . enoxaparin (LOVENOX) injection  30 mg Subcutaneous Q24H  . feeding supplement (NEPRO CARB STEADY)  237 mL Oral Q24H  . insulin aspart  0-9 Units Subcutaneous TID WC  . latanoprost  1 drop Both Eyes [id number]  . multivitamin with minerals  1 tablet Oral Daily  . pantoprazole (PROTONIX) IV  40 mg Intravenous Q24H  . senna-docusate  1 tablet Oral BID   Continuous Infusions: . sodium chloride 60 mL/hr at 02/09/20 1827     LOS: 5 days    Time spent: 35 minutes    Laurice Kimmons Darleen Crocker, DO [facility]  If 7PM-7AM, please contact night-coverage [email or web address] 02/11/2020, 11:32 AM

## 2020-02-11 NOTE — Progress Notes (Signed)
Nutrition Follow-up  DOCUMENTATION CODES:   Not applicable  INTERVENTION:  Discontinue Nepro po daily  Boost Breeze po BID, each supplement provides 250 kcal and 9 grams of protein  Continue MVI daily  Nursing staff to continue to provide feeding assistance with meals  NUTRITION DIAGNOSIS:   Inadequate oral intake related to poor appetite as evidenced by per patient/family report, percent weight loss. -ongoing  GOAL:   Patient will meet greater than or equal to 90% of their needs -progressing  MONITOR:   Labs, I & O's, Supplement acceptance, PO intake, Weight trends  REASON FOR ASSESSMENT:   Malnutrition Screening Tool, Consult Other (Comment) (abnormal wt loss)  ASSESSMENT:   86 year old male admitted for acute kidney injury presented with difficulty ambulating, weakness and malaise over the past week. Past medical history significant of dementia, CKD stage III, DM2, OSA not on CPAP, glaucoma, and being evaluated for gallbladder disease by PCP.  Patient awake, alert and pleasantly confused laying in bed this afternoon, no family in room. Noted untouched lunch tray with ice pop and broth at bedside. Patient denies abdominal pain and nausea. RD inquired about po of breakfast and lunch meals, unable to obtain meaningful answer.   Diet changed to CM on 7/25, per flowsheets he is eating 0-50% (32% average) of the last 8 documented meals and has refused last 3 Nepro supplements. Will discontinue Nepro and try Boost Breeze supplement given reported nausea.   Noted abdominal pain with cholelithiasis, abdominal ultrasound completed on 7/27 with recommendation for MRI, currently pending.   Medications reviewed and include: D3, SSI NaCl @ 60 ml/hr Labs: CBGs [phone]  Diet Order:   Diet Order            Diet Carb Modified Fluid consistency: Thin; Room service appropriate? Yes  Diet effective now                 EDUCATION NEEDS:   Education needs have been  addressed  Skin:  Skin Assessment: Reviewed RN Assessment  Last BM:  7/23  Height:   Ht Readings from Last 1 Encounters:  02/06/20 5\' 11"  (1.803 m)    Weight:   Wt Readings from Last 1 Encounters:  02/06/20 77 kg    BMI:  Body mass index is 23.68 kg/m.  Estimated Nutritional Needs:   Kcal:  9169-4503  Protein:  85-96  Fluid:  1.9 L   Lajuan Lines, RD, LDN Clinical Nutrition After Hours/Weekend Pager # in [place]

## 2020-02-11 NOTE — Progress Notes (Signed)
Physical Therapy Treatment Patient Details Name: Jerome Chambers MRN: [id number] DOB: 15-Oct-1928 Today's Date: 02/11/2020    History of Present Illness Jerome Chambers is a 86 y.o. male with medical history significant of dementia, stage 3 CKD, type 2 diabetes mellitus, OSA not on CPAP, HTN, glaucoma who has been having symptoms of weakness and malaise over the past week.  He has been working with his primary care physician and they have been evaluating him for gallbladder disease.  He had an episode yesterday where he stood up from a seated position and passed out.  He fell on his right arm.  He was unable to stand up after that.  His wife had to get assistance to get him up off the floor.  He had complained of some right arm pain other wise he had no complaints.  He is still having difficulty ambulating.  His wife reports that he has been fully vaccinated for COVID-19.    PT Comments    Patient demonstrates poor carryover for following instructions requiring repeated verbal/tactile cueing to complete functional tasks and exercises.  Patient able to stand with RW x 2 trials, limited to a couple of shuffling side steps before having to sit due to BLE weakness/fatigue.  Patient unable to transfer using RW and required Max stand pivot to transfer to chair.  Patient tolerated sitting up in chair after therapy with his spouse present in room - nursing staff notified.  Patient will benefit from continued physical therapy in hospital and recommended venue below to increase strength, balance, endurance for safe ADLs and gait.    Follow Up Recommendations  SNF     Equipment Recommendations  Rolling walker with 5" wheels    Recommendations for Other Services       Precautions / Restrictions Precautions Precautions: Fall Restrictions Weight Bearing Restrictions: No    Mobility  Bed Mobility Overal bed mobility: Needs Assistance Bed Mobility: Supine to Sit     Supine to sit: Mod assist     General  bed mobility comments: increased time, labored movement  Transfers Overall transfer level: Needs assistance Equipment used: Rolling walker (2 wheeled);1 person hand held assist Transfers: Sit to/from [facility] Sit to Stand: Mod assist;Max assist Stand pivot transfers: Max assist       General transfer comment: very unsteady on feet with RLE buckling due to weakness, poor carryover for following instructions when using RW  Ambulation/Gait Ambulation/Gait assistance: Max assist Gait Distance (Feet): 2 Feet Assistive device: Rolling walker (2 wheeled) Gait Pattern/deviations: Decreased step length - right;Decreased step length - left;Decreased stance time - right;Decreased stride length;Shuffle Gait velocity: slow   General Gait Details: limited to 1-2 shuffling unsteady labored side steps due to BLE weakness   Stairs             Wheelchair Mobility    Modified Rankin (Stroke Patients Only)       Balance Overall balance assessment: Needs assistance Sitting-balance support: Feet supported Sitting balance-Leahy Scale: Fair Sitting balance - Comments: frequent leaning on elbows while seated at EOB   Standing balance support: During functional activity;Bilateral upper extremity supported Standing balance-Leahy Scale: Poor Standing balance comment: using RW                            Cognition Arousal/Alertness: Awake/alert Behavior During Therapy: WFL for tasks assessed/performed Overall Cognitive Status: History of cognitive impairments - at baseline  General Comments: requires repeated verbal/tactile cueing to follow instructions      Exercises General Exercises - Lower Extremity Toe Raises: Seated;AROM;Strengthening;10 reps Heel Raises: Seated;AROM;Strengthening;10 reps    General Comments        Pertinent Vitals/Pain Pain Assessment: Faces Faces Pain Scale: Hurts a little  bit Pain Location: abdomen with pressure Pain Descriptors / Indicators: Sore Pain Intervention(s): Limited activity within patient's tolerance;Monitored during session    Home Living                      Prior Function            PT Goals (current goals can now be found in the care plan section) Acute Rehab PT Goals Patient Stated Goal: return home PT Goal Formulation: With patient/family Time For Goal Achievement: 02/20/20 Potential to Achieve Goals: Good Progress towards PT goals: Progressing toward goals    Frequency    Min 3X/week      PT Plan Current plan remains appropriate    Co-evaluation              AM-PAC PT "6 Clicks" Mobility   Outcome Measure  Help needed turning from your back to your side while in a flat bed without using bedrails?: A Lot Help needed moving from lying on your back to sitting on the side of a flat bed without using bedrails?: A Lot Help needed moving to and from a bed to a chair (including a wheelchair)?: A Lot Help needed standing up from a chair using your arms (e.g., wheelchair or bedside chair)?: A Lot Help needed to walk in hospital room?: Total Help needed climbing 3-5 steps with a railing? : Total 6 Click Score: 10    End of Session   Activity Tolerance: Patient tolerated treatment well;Patient limited by fatigue Patient left: in chair;with call [place]/phone within reach;with family/visitor present;with chair alarm set Nurse Communication: Mobility status PT Visit Diagnosis: Unsteadiness on feet (R26.81);Other abnormalities of gait and mobility (R26.89);Muscle weakness (generalized) (M62.81)     Time: 3276-1470 PT Time Calculation (min) (ACUTE ONLY): 30 min  Charges:  $Therapeutic Activity: 23-37 mins                     2:53 PM, 02/11/20 Lonell Grandchild, MPT Physical Therapist with [facility] 336 [phone] office [phone] mobile phone

## 2020-02-12 DIAGNOSIS — N179 Acute kidney failure, unspecified: Secondary | ICD-10-CM | POA: Diagnosis not present

## 2020-02-12 LAB — CBC
HCT: 32.6 % — ABNORMAL LOW (ref 39.0–52.0)
Hemoglobin: 10.6 g/dL — ABNORMAL LOW (ref 13.0–17.0)
MCH: 32.3 pg (ref 26.0–34.0)
MCHC: 32.5 g/dL (ref 30.0–36.0)
MCV: 99.4 fL (ref 80.0–100.0)
Platelets: 162 10*3/uL (ref 150–400)
RBC: 3.28 MIL/uL — ABNORMAL LOW (ref 4.22–5.81)
RDW: 12.9 % (ref 11.5–15.5)
WBC: 10.1 10*3/uL (ref 4.0–10.5)
nRBC: 0 % (ref 0.0–0.2)

## 2020-02-12 LAB — COMPREHENSIVE METABOLIC PANEL
ALT: 31 U/L (ref 0–44)
AST: 56 U/L — ABNORMAL HIGH (ref 15–41)
Albumin: 2.6 g/dL — ABNORMAL LOW (ref 3.5–5.0)
Alkaline Phosphatase: 322 U/L — ABNORMAL HIGH (ref 38–126)
Anion gap: 11 (ref 5–15)
BUN: 33 mg/dL — ABNORMAL HIGH (ref 8–23)
CO2: 22 mmol/L (ref 22–32)
Calcium: 8.8 mg/dL — ABNORMAL LOW (ref 8.9–10.3)
Chloride: 102 mmol/L (ref 98–111)
Creatinine, Ser: 1.68 mg/dL — ABNORMAL HIGH (ref 0.61–1.24)
GFR calc Af Amer: 41 mL/min — ABNORMAL LOW (ref >60)
GFR calc non Af Amer: 35 mL/min — ABNORMAL LOW (ref >60)
Glucose, Bld: 141 mg/dL — ABNORMAL HIGH (ref 70–99)
Potassium: 3.7 mmol/L (ref 3.5–5.1)
Sodium: 135 mmol/L (ref 135–145)
Total Bilirubin: 1.1 mg/dL (ref 0.3–1.2)
Total Protein: 6.6 g/dL (ref 6.5–8.1)

## 2020-02-12 LAB — GLUCOSE, CAPILLARY
Glucose-Capillary: 131 mg/dL — ABNORMAL HIGH (ref 70–99)
Glucose-Capillary: 139 mg/dL — ABNORMAL HIGH (ref 70–99)
Glucose-Capillary: 93 mg/dL (ref 70–99)
Glucose-Capillary: 109 mg/dL — ABNORMAL HIGH (ref 70–99)
Glucose-Capillary: 120 mg/dL — ABNORMAL HIGH (ref 70–99)

## 2020-02-12 LAB — MAGNESIUM: Magnesium: 1.8 mg/dL (ref 1.7–2.4)

## 2020-02-12 MED ORDER — ENOXAPARIN SODIUM 30 MG/0.3ML ~~LOC~~ SOLN: 30.0000 mg | SUBCUTANEOUS | Status: DC

## 2020-02-12 NOTE — Progress Notes (Signed)
  Scheduled for liver mass biopsy in [facility] 02/13/20   Pt to be at [place] 900 am for procedure Orders in for RN to arrange ambulance to [facility] and back  He will return to [facility] after procedure  He consents self per RN Npo Lovenox held

## 2020-02-12 NOTE — Progress Notes (Signed)
[facility]  Patient with large liver mass and satellite lesions. Plan for IR biopsy tomorrow. Likely pain is associated with the liver lesion and LFT elevations is associated with this also.  No plans for cholecystectomy at this time. I discussed with his wife and son.   Curlene Labrum, MD [facility] [address] [place], [place] [address] [phone] (office)

## 2020-02-12 NOTE — Progress Notes (Signed)
PROGRESS NOTE    Jerome Chambers  FTD:[id number] DOB: [birth year] DOA: 02/06/2020 PCP: Rosita Fire, MD   Brief Narrative:  86 y.o.malewith medical history significant ofdementia, stage 3b CKD, type 2 diabetes mellitus, OSA not on CPAP, HTN, glaucoma whohas been having symptoms of weakness and malaise over the past week. He has been working with his primary care physician and they have been evaluating him for gallbladder disease. He had an episode yesterday where he stood up from a seated position and passed out. He fell on his right arm. He was unable to stand up after that. His wife had to get assistance to get him up off the floor. He had complained of some right arm pain other wise he had no complaints. He is still having difficulty ambulating. His wife reports that he has been fully vaccinated for COVID-19.  -Patient continues to have abdominal pain.  MRI with noted hepatic masses.  Family agreeable to biopsy with IR.  Assessment & Plan:   Principal Problem:   AKI (acute kidney injury) ([place]) Active Problems:   Unspecified glaucoma   Essential hypertension   Allergic rhinitis   Osteoarthritis   Senile dementia ([place])   Type 2 diabetes mellitus without complication (HCC)   Acute renal failure superimposed on stage 3 chronic kidney disease (HCC)   UTI (urinary tract infection)   Cystitis   Thrombocytopenia (HCC)   Anemia in chronic kidney disease (CKD)   Gait instability   At risk for falling   RUQ abdominal pain   Calculus of gallbladder without cholecystitis without obstruction   1. AKI on stage 3b CKD - improving, suspect prerenal as he is clinically dehydrated and wife confirms poor oral intake over last 2 days prior to admission. Treating with gentle IV fluids and recheck BMP in AM. Hold nephrotoxic agents and renally dose meds as needed.  Continue IV fluid. 2. Abdominal pain with cholelithiasis and liver masses.  Continues to have ongoing transaminitis and poor  dietary tolerance.  Abdominal ultrasound performed 7/27 with recommendation for MRI.    MRI results with significant liver masses noted.  Family agreeable to consultation with IR for biopsy.  No further plans from general surgery standpoint. 3. Cystitis - TREATED. Pt presented with urinary findings of acute cystitis. Continue ceftriaxone IV and follow urine culture results. UC insignificant growth.  4. Thrombocytopenia - improved, platelets up to 162.  5. Type 2 diabetes mellitus - SSI coverage and CBG testing ordered. BS controlled.  6. Dementia - delirium precautions, family planning to stay with him as much as possible.  7. Gait instability / generalized weakness - PT evaluation recommending SNF placement, TOC consulted to assist with placement. Still awaiting bed placement.  8. Right arm pain from fall - xray negative for fracture. Tylenol as needed for pain.  9. HTN - resume home meds and follow. 10. Constipation - added peri-colace BID and given suppository.  DVT prophylaxis:lovenox Code Status:full Family Communication:Wife updated at bedside7/29 along with son on phone Disposition Plan:Plan for further evaluation with biopsy of liver masses.  Anticipate SNF on discharge. Admission status:INP Status is: Inpatient  Remains inpatient appropriate because:IV treatments appropriate due to intensity of illness or inability to take PO and Inpatient level of care appropriate due to severity of illness.  Awaiting SNF bed placement, but also requiring general surgery evaluation today.   Dispo: The patient is from: Home Anticipated d/c is to: SNF Anticipated d/c date is: 1-2 days   Patient is currently not medically  stable for discharge and will require biopsy via IR for liver mass evaluation.  Consultants:   General surgery-Dr. Constance Haw  IR  Procedures:   See below  Antimicrobials:  Anti-infectives (From admission, onward)    Start     Dose/Rate Route Frequency Ordered Stop   02/07/20 0900  cefTRIAXone (ROCEPHIN) 1 g in sodium chloride 0.9 % 100 mL IVPB        1 g 200 mL/hr over 30 Minutes Intravenous Every 24 hours 02/06/20 2043 02/08/20 0913   02/06/20 0930  cefTRIAXone (ROCEPHIN) 1 g in sodium chloride 0.9 % 100 mL IVPB        1 g 200 mL/hr over 30 Minutes Intravenous  Once 02/06/20 0926 02/06/20 1030       Subjective: Patient seen and evaluated today and was noted to be somnolent this morning.  He denies any particular complaints this morning.  Wife at bedside.  Objective: Vitals:   02/11/20 1931 02/11/20 2123 02/12/20 0519 02/12/20 0823  BP:  (!) 131/81 (!) 140/93 122/72  Pulse:  83 90 88  Resp:  20 20 16   Temp:  97.9 F (36.6 C) 98.2 F (36.8 C)   TempSrc:  Oral Oral   SpO2: 97% 99% 99% 97%  Weight:      Height:        Intake/Output Summary (Last 24 hours) at 02/12/2020 1230 Last data filed at 02/12/2020 0520 Gross per 24 hour  Intake --  Output 1150 ml  Net -1150 ml   Filed Weights   02/06/20 0630 02/06/20 2043  Weight: 86 kg 77 kg    Examination:  General exam: Appears calm and comfortable  Respiratory system: Clear to auscultation. Respiratory effort normal. Cardiovascular system: S1 & S2 heard, RRR. No JVD, murmurs, rubs, gallops or clicks. No pedal edema. Gastrointestinal system: Abdomen is soft and tender on palpation throughout. Central nervous system: Alert and awake Extremities: No edema Skin: No rashes, lesions or ulcers Psychiatry: Pleasant affect    Data Reviewed: I have personally reviewed following labs and imaging studies  CBC: Recent Labs  Lab 02/06/20 0811 02/07/20 0602 02/10/20 0412 02/11/20 0411 02/12/20 0413  WBC 7.8 6.4 11.4* 11.1* 10.1  NEUTROABS 6.7 4.6 9.8* 9.6*  --   HGB 11.1* 11.0* 10.6* 10.9* 10.6*  HCT 33.9* 34.3* 32.8* 33.6* 32.6*  MCV 101.5* 101.2* 101.9* 100.0 99.4  PLT 120* 131* 136* 152 496   Basic Metabolic Panel: Recent Labs   Lab 02/08/20 0603 02/09/20 0459 02/10/20 0412 02/11/20 0411 02/12/20 0413  NA 137 137 137 136 135  K 4.4 4.1 4.9 4.0 3.7  CL 103 102 103 99 102  CO2 23 22 21* 24 22  GLUCOSE 115* 127* 160* 158* 141*  BUN 32* 36* 35* 35* 33*  CREATININE 1.99* 2.01* 1.87* 1.65* 1.68*  CALCIUM 9.2 9.1 9.1 9.3 8.8*  MG 1.8  --   --  1.9 1.8   GFR: Estimated Creatinine Clearance: 31.7 mL/min (A) (by C-G formula based on SCr of 1.68 mg/dL (H)). Liver Function Tests: Recent Labs  Lab 02/06/20 0811 02/10/20 0412 02/11/20 0411 02/12/20 0413  AST 79* 83* 63* 56*  ALT 40 39 36 31  ALKPHOS 448* 376* 328* 322*  BILITOT 1.2 1.3* 1.1 1.1  PROT 8.0 7.3 7.3 6.6  ALBUMIN 3.7 2.9* 2.8* 2.6*   No results for input(s): LIPASE, AMYLASE in the last 168 hours. No results for input(s): AMMONIA in the last 168 hours. Coagulation Profile: Recent Labs  Lab 02/07/20  0602  INR 1.3*   Cardiac Enzymes: No results for input(s): CKTOTAL, CKMB, CKMBINDEX, TROPONINI in the last 168 hours. BNP (last 3 results) No results for input(s): PROBNP in the last 8760 hours. HbA1C: No results for input(s): HGBA1C in the last 72 hours. CBG: Recent Labs  Lab 02/11/20 1647 02/11/20 2126 02/12/20 0339 02/12/20 0800 02/12/20 1149  GLUCAP 141* 141* 131* 139* 93   Lipid Profile: No results for input(s): CHOL, HDL, LDLCALC, TRIG, CHOLHDL, LDLDIRECT in the last 72 hours. Thyroid Function Tests: No results for input(s): TSH, T4TOTAL, FREET4, T3FREE, THYROIDAB in the last 72 hours. Anemia Panel: No results for input(s): VITAMINB12, FOLATE, FERRITIN, TIBC, IRON, RETICCTPCT in the last 72 hours. Sepsis Labs: No results for input(s): PROCALCITON, LATICACIDVEN in the last 168 hours.  Recent Results (from the past 240 hour(s))  SARS Coronavirus 2 by RT PCR (hospital order, performed in [facility] hospital lab) Nasopharyngeal Nasopharyngeal Swab     Status: None   Collection Time: 02/06/20  8:10 AM   Specimen: Nasopharyngeal  Swab  Result Value Ref Range Status   SARS Coronavirus 2 NEGATIVE NEGATIVE Final    Comment: (NOTE) SARS-CoV-2 target nucleic acids are NOT DETECTED.  The SARS-CoV-2 RNA is generally detectable in upper and lower respiratory specimens during the acute phase of infection. The lowest concentration of SARS-CoV-2 viral copies this assay can detect is 250 copies / mL. A negative result does not preclude SARS-CoV-2 infection and should not be used as the sole basis for treatment or other patient management decisions.  A negative result may occur with improper specimen collection / handling, submission of specimen other than nasopharyngeal swab, presence of viral mutation(s) within the areas targeted by this assay, and inadequate number of viral copies (<250 copies / mL). A negative result must be combined with clinical observations, patient history, and epidemiological information.  Fact Sheet for Patients:   StrictlyIdeas.no  Fact Sheet for Healthcare Providers: BankingDealers.co.za  This test is not yet approved or  cleared by the [place] FDA and has been authorized for detection and/or diagnosis of SARS-CoV-2 by FDA under an Emergency Use Authorization (EUA).  This EUA will remain in effect (meaning this test can be used) for the duration of the COVID-19 declaration under Section 564(b)(1) of the Act, 21 U.S.C. section 360bbb-3(b)(1), unless the authorization is terminated or revoked sooner.  Performed at [facility], [address], [place], [place] [address]   Urine Culture     Status: Abnormal   Collection Time: 02/06/20  9:27 AM   Specimen: Urine, Clean Catch  Result Value Ref Range Status   Specimen Description   Final    URINE, CLEAN CATCH Performed at [facility], [address], [place], [place] [address]    Special Requests   Final    NONE Performed at [facility], [address] [place], [place] [address]     Culture (A)  Final    <10,000 COLONIES/mL INSIGNIFICANT GROWTH Performed at [facility] [address] [place],  [address]    Report Status 02/08/2020 FINAL  Final         Radiology Studies: US Abdomen Complete  Result Date: 02/10/2020 CLINICAL DATA:  Right upper quadrant abdominal pain. Cholelithiasis. EXAM: ABDOMEN ULTRASOUND COMPLETE COMPARISON:  Right upper quadrant ultrasound 1 week ago 02/03/2020 FINDINGS: Gallbladder: Gallbladder is difficult to visualize. Suspected gallbladder filled with stones creating a wall echo shadow complex with posterior shadowing. No sonographic Murphy sign noted by sonographer. Common  bile duct: Diameter: 3-4 mm, normal. Liver: Heterogeneous in parenchymal echogenicity. Hyperechoic lesion measuring 1.2 cm is typical of hemangioma. The larger lesion on prior ultrasound is not well-defined on the current exam, currently blends with heterogeneous adjacent parenchyma. Portal vein is patent on color Doppler imaging with normal direction of blood flow towards the liver. IVC: No abnormality visualized. Pancreas: Visualized portion unremarkable. Spleen: Normal in size. Few scattered echogenic foci may represent granulomas. Right Kidney: Length: 8.5 cm. Echogenicity within normal limits. The lower pole is partially obscured by shadowing bowel gas. No mass or hydronephrosis visualized. Left Kidney: Length: 9.7 cm. Echogenicity within normal limits. Elise 2 cysts largest measuring 4 cm in the upper pole. No solid mass or hydronephrosis visualized. Abdominal aorta: No aneurysm visualized. Other findings: Trace ascites. Suspected small right pleural effusion. IMPRESSION: 1. Gallbladder is difficult to delineate, however may be contracted and filled with stones creating a wall echo shadow complex. No sonographic Murphy sign. If there is clinical concern for acute cholecystitis, consider further evaluation with nuclear medicine HIDA scan. 2. No biliary dilatation. 3.  Heterogeneous hepatic parenchyma. Small hyperechoic lesion is likely hemangioma. The larger lesion on prior ultrasound is not well-defined on the current exam and currently blends with adjacent heterogeneous parenchyma. This could be further evaluated with hepatic protocol MRI on an elective basis. 4. Trace abdominal ascites. Electronically Signed   By: Keith Rake M.D.   On: 02/10/2020 16:00   MR 3D Recon At Scanner  Result Date: 02/12/2020 CLINICAL DATA:  86 year old male with history of cholelithiasis. Evaluate for possible cholecystitis. Indeterminate hepatic lesions noted on ultrasound examination. EXAM: MRI ABDOMEN WITHOUT AND WITH CONTRAST (INCLUDING MRCP) TECHNIQUE: Multiplanar multisequence MR imaging of the abdomen was performed both before and after the administration of intravenous contrast. Heavily T2-weighted images of the biliary and pancreatic ducts were obtained, and three-dimensional MRCP images were rendered by post processing. CONTRAST:  63mL GADAVIST GADOBUTROL 1 MMOL/ML IV SOLN COMPARISON:  No prior abdominal MRI. Abdominal ultrasound 02/10/2020. FINDINGS: Lower chest: Small right pleural effusion lying dependently. Some associated dependent subsegmental atelectasis in the right lower lobe. Hepatobiliary: There is a large infiltrative mass centered predominantly in the right lobe of the liver estimated to measure approximately 13.9 x 8.8 x 9.9 cm (axial image 16 of series 4 and coronal image 16 of series 5) with multiple other smaller satellite lesions, most of which are clustered in segment 4A of the liver. These lesions all demonstrate low T1 signal intensity, high T2 signal intensity, diffusion restriction, and demonstrate heterogeneous enhancement on post gadolinium imaging. No intra or extrahepatic biliary ductal dilatation noted on MRCP images. Common bile duct measures 4 mm in the porta hepatis. No filling defect in the common bile duct to suggest choledocholithiasis. There are  numerous small filling defects within the lumen of the gallbladder, compatible with gallstones. Gallbladder appears completely contracted around these indwelling stones. No findings to suggest an acute cholecystitis at this time. Pancreas: No pancreatic mass. No pancreatic ductal dilatation noted on MRCP images. No pancreatic or peripancreatic fluid collections or inflammatory changes. Spleen:  Unremarkable. Adrenals/Urinary Tract: Multiple T1 hypointense, T2 hyperintense, nonenhancing lesions in both kidneys compatible with simple cysts, largest of which measures 3.2 cm in the upper pole of the left kidney. Additionally, in the lateral aspect of the lower pole of the left kidney (axial image 49 of series 1122) there is a T1 hyperintense, T2 hypointense, nonenhancing lesion, compatible with a small proteinaceous/hemorrhagic cyst. No suspicious renal lesions. No hydroureteronephrosis  in the visualized portions of the abdomen. Bilateral adrenal glands are normal in appearance. Stomach/Bowel: Stomach is unremarkable in appearance. Visualized portions of small bowel and colon do not appear dilated. Numerous colonic diverticulae. Vascular/Lymphatic: No aneurysm noted in the abdominal vasculature. No lymphadenopathy noted in the abdomen. Other:  Trace volume of ascites. Musculoskeletal: No aggressive appearing osseous lesions are noted in the visualized portions of the skeleton. IMPRESSION: 1. Innumerable malignant appearing lesions in the hepatic parenchyma, most notable for a dominant mass in the right lobe of the liver which currently measures 13.9 x 8.8 x 9.9 cm. The imaging characteristics of these lesions are nonspecific, but appear grass of (presumably malignant). Correlation with biopsy is recommended to establish a tissue diagnosis if clinically appropriate. 2. Cholelithiasis without evidence of acute cholecystitis at this time. No choledocholithiasis or signs of biliary tract obstruction. 3. Colonic  diverticulosis. 4. Small right pleural effusion lying dependently. 5. Additional incidental findings, as above. Electronically Signed   By: Vinnie Langton M.D.   On: 02/12/2020 09:51   MR ABDOMEN MRCP W WO CONTAST  Result Date: 02/12/2020 CLINICAL DATA:  86 year old male with history of cholelithiasis. Evaluate for possible cholecystitis. Indeterminate hepatic lesions noted on ultrasound examination. EXAM: MRI ABDOMEN WITHOUT AND WITH CONTRAST (INCLUDING MRCP) TECHNIQUE: Multiplanar multisequence MR imaging of the abdomen was performed both before and after the administration of intravenous contrast. Heavily T2-weighted images of the biliary and pancreatic ducts were obtained, and three-dimensional MRCP images were rendered by post processing. CONTRAST:  85mL GADAVIST GADOBUTROL 1 MMOL/ML IV SOLN COMPARISON:  No prior abdominal MRI. Abdominal ultrasound 02/10/2020. FINDINGS: Lower chest: Small right pleural effusion lying dependently. Some associated dependent subsegmental atelectasis in the right lower lobe. Hepatobiliary: There is a large infiltrative mass centered predominantly in the right lobe of the liver estimated to measure approximately 13.9 x 8.8 x 9.9 cm (axial image 16 of series 4 and coronal image 16 of series 5) with multiple other smaller satellite lesions, most of which are clustered in segment 4A of the liver. These lesions all demonstrate low T1 signal intensity, high T2 signal intensity, diffusion restriction, and demonstrate heterogeneous enhancement on post gadolinium imaging. No intra or extrahepatic biliary ductal dilatation noted on MRCP images. Common bile duct measures 4 mm in the porta hepatis. No filling defect in the common bile duct to suggest choledocholithiasis. There are numerous small filling defects within the lumen of the gallbladder, compatible with gallstones. Gallbladder appears completely contracted around these indwelling stones. No findings to suggest an acute  cholecystitis at this time. Pancreas: No pancreatic mass. No pancreatic ductal dilatation noted on MRCP images. No pancreatic or peripancreatic fluid collections or inflammatory changes. Spleen:  Unremarkable. Adrenals/Urinary Tract: Multiple T1 hypointense, T2 hyperintense, nonenhancing lesions in both kidneys compatible with simple cysts, largest of which measures 3.2 cm in the upper pole of the left kidney. Additionally, in the lateral aspect of the lower pole of the left kidney (axial image 49 of series 1122) there is a T1 hyperintense, T2 hypointense, nonenhancing lesion, compatible with a small proteinaceous/hemorrhagic cyst. No suspicious renal lesions. No hydroureteronephrosis in the visualized portions of the abdomen. Bilateral adrenal glands are normal in appearance. Stomach/Bowel: Stomach is unremarkable in appearance. Visualized portions of small bowel and colon do not appear dilated. Numerous colonic diverticulae. Vascular/Lymphatic: No aneurysm noted in the abdominal vasculature. No lymphadenopathy noted in the abdomen. Other:  Trace volume of ascites. Musculoskeletal: No aggressive appearing osseous lesions are noted in the visualized portions of the  skeleton. IMPRESSION: 1. Innumerable malignant appearing lesions in the hepatic parenchyma, most notable for a dominant mass in the right lobe of the liver which currently measures 13.9 x 8.8 x 9.9 cm. The imaging characteristics of these lesions are nonspecific, but appear grass of (presumably malignant). Correlation with biopsy is recommended to establish a tissue diagnosis if clinically appropriate. 2. Cholelithiasis without evidence of acute cholecystitis at this time. No choledocholithiasis or signs of biliary tract obstruction. 3. Colonic diverticulosis. 4. Small right pleural effusion lying dependently. 5. Additional incidental findings, as above. Electronically Signed   By: Vinnie Langton M.D.   On: 02/12/2020 09:51        Scheduled  Meds: . acetaminophen  650 mg Oral Q6H   Or  . acetaminophen  650 mg Rectal Q6H  . amLODipine  5 mg Oral Daily  . aspirin EC  81 mg Oral Daily  . cholecalciferol  1,000 Units Oral q AM  . enoxaparin (LOVENOX) injection  30 mg Subcutaneous Q24H  . feeding supplement  1 Container Oral BID BM  . insulin aspart  0-9 Units Subcutaneous TID WC  . latanoprost  1 drop Both Eyes [id number]  . multivitamin with minerals  1 tablet Oral Daily  . pantoprazole (PROTONIX) IV  40 mg Intravenous Q24H  . senna-docusate  1 tablet Oral BID   Continuous Infusions: . sodium chloride 60 mL/hr at 02/09/20 1827     LOS: 6 days    Time spent: 35 minutes    Jerome Deutscher Darleen Crocker, DO [facility]  If 7PM-7AM, please contact night-coverage [email or web address] 02/12/2020, 12:30 PM

## 2020-02-13 ENCOUNTER — Inpatient Hospital Stay
Admission: RE | Admit: 2020-02-13 | Discharge: 2020-02-24 | Disposition: A | Discharge status: Home / Self Care | Payer: Medicare Other | Source: Ambulatory Visit | Attending: Internal Medicine | Admitting: Internal Medicine

## 2020-02-13 ENCOUNTER — Ambulatory Visit (HOSPITAL_COMMUNITY): Payer: Medicare Other

## 2020-02-13 ENCOUNTER — Other Ambulatory Visit: Payer: Self-pay

## 2020-02-13 DIAGNOSIS — N1330 Unspecified hydronephrosis: Secondary | ICD-10-CM

## 2020-02-13 DIAGNOSIS — M7989 Other specified soft tissue disorders: Principal | ICD-10-CM

## 2020-02-13 DIAGNOSIS — N179 Acute kidney failure, unspecified: Secondary | ICD-10-CM | POA: Diagnosis not present

## 2020-02-13 DIAGNOSIS — R52 Pain, unspecified: Secondary | ICD-10-CM

## 2020-02-13 LAB — GLUCOSE, CAPILLARY
Glucose-Capillary: 107 mg/dL — ABNORMAL HIGH (ref 70–99)
Glucose-Capillary: 106 mg/dL — ABNORMAL HIGH (ref 70–99)
Glucose-Capillary: 123 mg/dL — ABNORMAL HIGH (ref 70–99)

## 2020-02-13 LAB — COMPREHENSIVE METABOLIC PANEL
ALT: 32 U/L (ref 0–44)
ALT: 33 U/L (ref 0–44)
AST: 80 U/L — ABNORMAL HIGH (ref 15–41)
AST: 95 U/L — ABNORMAL HIGH (ref 15–41)
Albumin: 2.4 g/dL — ABNORMAL LOW (ref 3.5–5.0)
Albumin: 2.5 g/dL — ABNORMAL LOW (ref 3.5–5.0)
Alkaline Phosphatase: 356 U/L — ABNORMAL HIGH (ref 38–126)
Alkaline Phosphatase: 381 U/L — ABNORMAL HIGH (ref 38–126)
Anion gap: 10 (ref 5–15)
Anion gap: 11 (ref 5–15)
BUN: 38 mg/dL — ABNORMAL HIGH (ref 8–23)
BUN: 38 mg/dL — ABNORMAL HIGH (ref 8–23)
CO2: 22 mmol/L (ref 22–32)
CO2: 22 mmol/L (ref 22–32)
Calcium: 8.6 mg/dL — ABNORMAL LOW (ref 8.9–10.3)
Calcium: 8.6 mg/dL — ABNORMAL LOW (ref 8.9–10.3)
Chloride: 103 mmol/L (ref 98–111)
Chloride: 103 mmol/L (ref 98–111)
Creatinine, Ser: 1.88 mg/dL — ABNORMAL HIGH (ref 0.61–1.24)
Creatinine, Ser: 1.98 mg/dL — ABNORMAL HIGH (ref 0.61–1.24)
GFR calc Af Amer: 36 mL/min — ABNORMAL LOW (ref >60)
GFR calc Af Amer: 34 mL/min — ABNORMAL LOW (ref >60)
GFR calc non Af Amer: 31 mL/min — ABNORMAL LOW (ref >60)
GFR calc non Af Amer: 29 mL/min — ABNORMAL LOW (ref >60)
Glucose, Bld: 110 mg/dL — ABNORMAL HIGH (ref 70–99)
Glucose, Bld: 118 mg/dL — ABNORMAL HIGH (ref 70–99)
Potassium: 3.5 mmol/L (ref 3.5–5.1)
Potassium: 4.3 mmol/L (ref 3.5–5.1)
Sodium: 135 mmol/L (ref 135–145)
Sodium: 136 mmol/L (ref 135–145)
Total Bilirubin: 0.9 mg/dL (ref 0.3–1.2)
Total Bilirubin: 1.1 mg/dL (ref 0.3–1.2)
Total Protein: 6.1 g/dL — ABNORMAL LOW (ref 6.5–8.1)
Total Protein: 6.3 g/dL — ABNORMAL LOW (ref 6.5–8.1)

## 2020-02-13 LAB — SURGICAL PCR SCREEN
MRSA, PCR: NEGATIVE
Staphylococcus aureus: NEGATIVE

## 2020-02-13 LAB — MAGNESIUM
Magnesium: 1.8 mg/dL (ref 1.7–2.4)
Magnesium: 1.9 mg/dL (ref 1.7–2.4)

## 2020-02-13 MED ORDER — SENNOSIDES-DOCUSATE SODIUM 8.6-50 MG PO TABS: 1.0000 | ORAL_TABLET | Freq: Two times a day (BID) | ORAL | 0 refills | Status: AC

## 2020-02-13 MED ORDER — ONDANSETRON HCL 4 MG PO TABS: 4.0000 mg | ORAL_TABLET | Freq: Four times a day (QID) | ORAL | 0 refills | Status: AC | PRN

## 2020-02-13 MED ORDER — FENTANYL CITRATE (PF) 100 MCG/2ML IJ SOLN
INTRAMUSCULAR | Status: AC | PRN
  Administered 2020-02-13: 25 ug via INTRAVENOUS

## 2020-02-13 MED ORDER — ENSURE ENLIVE PO LIQD
237.0000 mL | Freq: Two times a day (BID) | ORAL | Status: DC
  Administered 2020-02-13: 237 mL via ORAL

## 2020-02-13 MED ORDER — MIDAZOLAM HCL 2 MG/2ML IJ SOLN
INTRAMUSCULAR | Status: AC | PRN
  Administered 2020-02-13: 0.5 mg via INTRAVENOUS

## 2020-02-13 MED ORDER — TERBINAFINE HCL 1 % EX CREA: TOPICAL_CREAM | Freq: Every day | CUTANEOUS | 0 refills | Status: AC | PRN

## 2020-02-13 MED ORDER — TRAZODONE HCL 50 MG PO TABS: 25.0000 mg | ORAL_TABLET | Freq: Every evening | ORAL | 0 refills | Status: DC | PRN

## 2020-02-13 MED ORDER — SODIUM CHLORIDE 0.9 % IV SOLN
INTRAVENOUS | Status: AC | PRN
  Administered 2020-02-13: 10 mL/h via INTRAVENOUS

## 2020-02-13 MED ORDER — OXYCODONE HCL 5 MG PO TABS: 5.0000 mg | ORAL_TABLET | ORAL | 0 refills | Status: DC | PRN

## 2020-02-13 NOTE — Consult Note (Addendum)
Chief Complaint: Liver lesion  Referring Physician(s): Dr. Manuella Ghazi  Supervising Physician: Sandi Mariscal  Patient Status: [facility] - In-pt  History of Present Illness: Jerome Chambers is a 86 y.o. male inpatient (AP). History of dementia, CKD, HTN. Admitted for weakness and malaise and abdominal pain found to have a hepatic mass. Team is requesting a liver biopsy for further determination of  Pathology. Patient has a MR Abdomen from 7.29.21 which reads Innumerable malignant appearing lesions in the hepatic parenchyma, most notable for a dominant mass in the right lobe of the liver which currently measures 13.9 x 8.8 x 9.9 cm. The imaging characteristics of these lesions are nonspecific, but appear grass of (presumably malignant). Correlation with biopsy is recommended to establish a tissue diagnosis if clinically appropriative. Team is requesting a liver biopsy for further determination of  Pathology  Past Medical History:  Diagnosis Date  . Allergic rhinitis, cause unspecified   . Dementia ([place])   . Diabetes ([place])   . Diverticulosis   . DJD (degenerative joint disease)   . Glaucoma   . History of Helicobacter pylori infection   . Sleep apnea    NO CPAP MACHINE   . Unspecified essential hypertension     History reviewed. No pertinent surgical history.  Allergies: Patient has no known allergies.  Medications: Prior to Admission medications   Medication Sig Start Date End Date Taking? Authorizing Provider  amLODipine (NORVASC) 2.5 MG tablet Take 2.5 mg by mouth daily.   Yes [provider]  aspirin 81 MG tablet Take 81 mg by mouth daily.   Yes [provider]  Cholecalciferol (VITAMIN D3) 25 MCG (1000 UT) CAPS Take 1 capsule by mouth in the morning.   Yes [provider]  econazole nitrate 1 % cream Apply 1 application topically daily as needed (toe).   Yes [provider]  ferrous gluconate (FERGON) 324 MG tablet Take 324 mg by mouth daily with  breakfast.   Yes [provider]  glimepiride (AMARYL) 2 MG tablet Take 2 mg by mouth daily with breakfast.   Yes [provider]  latanoprost (XALATAN) 0.005 % ophthalmic solution Place 1 drop into both eyes every morning.   Yes [provider]  Multiple Vitamin (MULTIVITAMIN WITH MINERALS) TABS tablet Take 1 tablet by mouth daily.   Yes [provider]     Family History  Problem Relation Age of Onset  . Colon cancer Neg Hx     Social History   Socioeconomic History  . Marital status: Married    Spouse name: Not on file  . Number of children: 3  . Years of education: Not on file  . Highest education level: Not on file  Occupational History  . Occupation: Retired   Tobacco Use  . Smoking status: Never Smoker  . Smokeless tobacco: Never Used  Vaping Use  . Vaping Use: Never used  Substance and Sexual Activity  . Alcohol use: No  . Drug use: No  . Sexual activity: Not on file  Other Topics Concern  . Not on file  Social History Narrative   Coffee daily    Social Determinants of Health   Financial Resource Strain:   . Difficulty of Paying Living Expenses:   Food Insecurity:   . Worried About Charity fundraiser in the Last Year:   . Arboriculturist in the Last Year:   Transportation Needs:   . Film/video editor (Medical):   Marland Kitchen Lack  of Transportation (Non-Medical):   Physical Activity:   . Days of Exercise per Week:   . Minutes of Exercise per Session:   Stress:   . Feeling of Stress :   Social Connections:   . Frequency of Communication with Friends and Family:   . Frequency of Social Gatherings with Friends and Family:   . Attends Religious Services:   . Active Member of Clubs or Organizations:   . Attends Archivist Meetings:   Marland Kitchen Marital Status:     Review of Systems: A 12 point ROS discussed and pertinent positives are indicated in the HPI above.  All other systems are negative.  Review of Systems  Unable  to perform ROS: Dementia    Vital Signs: BP 119/79 (BP Location: Left Arm)   Pulse 88   Temp 98.5 F (36.9 C) (Oral)   Resp 16   Ht 5\' 11"  (1.803 m)   Wt 169 lb 12.1 oz (77 kg)   SpO2 98%   BMI 23.68 kg/m   Physical Exam Vitals and nursing note reviewed.  Constitutional:      Appearance: He is well-developed.  HENT:     Head: Normocephalic.  Cardiovascular:     Rate and Rhythm: Normal rate and regular rhythm.     Heart sounds: Normal heart sounds.  Pulmonary:     Effort: Pulmonary effort is normal.  Musculoskeletal:        General: Normal range of motion.     Cervical back: Normal range of motion.  Skin:    General: Skin is dry.  Neurological:     Mental Status: He is alert and oriented to person, place, and time.     Imaging: US Abdomen Complete  Result Date: 02/10/2020 CLINICAL DATA:  Right upper quadrant abdominal pain. Cholelithiasis. EXAM: ABDOMEN ULTRASOUND COMPLETE COMPARISON:  Right upper quadrant ultrasound 1 week ago 02/03/2020 FINDINGS: Gallbladder: Gallbladder is difficult to visualize. Suspected gallbladder filled with stones creating a wall echo shadow complex with posterior shadowing. No sonographic Murphy sign noted by sonographer. Common bile duct: Diameter: 3-4 mm, normal. Liver: Heterogeneous in parenchymal echogenicity. Hyperechoic lesion measuring 1.2 cm is typical of hemangioma. The larger lesion on prior ultrasound is not well-defined on the current exam, currently blends with heterogeneous adjacent parenchyma. Portal vein is patent on color Doppler imaging with normal direction of blood flow towards the liver. IVC: No abnormality visualized. Pancreas: Visualized portion unremarkable. Spleen: Normal in size. Few scattered echogenic foci may represent granulomas. Right Kidney: Length: 8.5 cm. Echogenicity within normal limits. The lower pole is partially obscured by shadowing bowel gas. No mass or hydronephrosis visualized. Left Kidney: Length: 9.7 cm.  Echogenicity within normal limits. Elise 2 cysts largest measuring 4 cm in the upper pole. No solid mass or hydronephrosis visualized. Abdominal aorta: No aneurysm visualized. Other findings: Trace ascites. Suspected small right pleural effusion. IMPRESSION: 1. Gallbladder is difficult to delineate, however may be contracted and filled with stones creating a wall echo shadow complex. No sonographic Murphy sign. If there is clinical concern for acute cholecystitis, consider further evaluation with nuclear medicine HIDA scan. 2. No biliary dilatation. 3. Heterogeneous hepatic parenchyma. Small hyperechoic lesion is likely hemangioma. The larger lesion on prior ultrasound is not well-defined on the current exam and currently blends with adjacent heterogeneous parenchyma. This could be further evaluated with hepatic protocol MRI on an elective basis. 4. Trace abdominal ascites. Electronically Signed   By: Keith Rake M.D.   On: 02/10/2020 16:00  MR 3D Recon At Scanner  Result Date: 02/12/2020 CLINICAL DATA:  86 year old male with history of cholelithiasis. Evaluate for possible cholecystitis. Indeterminate hepatic lesions noted on ultrasound examination. EXAM: MRI ABDOMEN WITHOUT AND WITH CONTRAST (INCLUDING MRCP) TECHNIQUE: Multiplanar multisequence MR imaging of the abdomen was performed both before and after the administration of intravenous contrast. Heavily T2-weighted images of the biliary and pancreatic ducts were obtained, and three-dimensional MRCP images were rendered by post processing. CONTRAST:  34mL GADAVIST GADOBUTROL 1 MMOL/ML IV SOLN COMPARISON:  No prior abdominal MRI. Abdominal ultrasound 02/10/2020. FINDINGS: Lower chest: Small right pleural effusion lying dependently. Some associated dependent subsegmental atelectasis in the right lower lobe. Hepatobiliary: There is a large infiltrative mass centered predominantly in the right lobe of the liver estimated to measure approximately 13.9 x 8.8  x 9.9 cm (axial image 16 of series 4 and coronal image 16 of series 5) with multiple other smaller satellite lesions, most of which are clustered in segment 4A of the liver. These lesions all demonstrate low T1 signal intensity, high T2 signal intensity, diffusion restriction, and demonstrate heterogeneous enhancement on post gadolinium imaging. No intra or extrahepatic biliary ductal dilatation noted on MRCP images. Common bile duct measures 4 mm in the porta hepatis. No filling defect in the common bile duct to suggest choledocholithiasis. There are numerous small filling defects within the lumen of the gallbladder, compatible with gallstones. Gallbladder appears completely contracted around these indwelling stones. No findings to suggest an acute cholecystitis at this time. Pancreas: No pancreatic mass. No pancreatic ductal dilatation noted on MRCP images. No pancreatic or peripancreatic fluid collections or inflammatory changes. Spleen:  Unremarkable. Adrenals/Urinary Tract: Multiple T1 hypointense, T2 hyperintense, nonenhancing lesions in both kidneys compatible with simple cysts, largest of which measures 3.2 cm in the upper pole of the left kidney. Additionally, in the lateral aspect of the lower pole of the left kidney (axial image 49 of series 1122) there is a T1 hyperintense, T2 hypointense, nonenhancing lesion, compatible with a small proteinaceous/hemorrhagic cyst. No suspicious renal lesions. No hydroureteronephrosis in the visualized portions of the abdomen. Bilateral adrenal glands are normal in appearance. Stomach/Bowel: Stomach is unremarkable in appearance. Visualized portions of small bowel and colon do not appear dilated. Numerous colonic diverticulae. Vascular/Lymphatic: No aneurysm noted in the abdominal vasculature. No lymphadenopathy noted in the abdomen. Other:  Trace volume of ascites. Musculoskeletal: No aggressive appearing osseous lesions are noted in the visualized portions of the  skeleton. IMPRESSION: 1. Innumerable malignant appearing lesions in the hepatic parenchyma, most notable for a dominant mass in the right lobe of the liver which currently measures 13.9 x 8.8 x 9.9 cm. The imaging characteristics of these lesions are nonspecific, but appear grass of (presumably malignant). Correlation with biopsy is recommended to establish a tissue diagnosis if clinically appropriate. 2. Cholelithiasis without evidence of acute cholecystitis at this time. No choledocholithiasis or signs of biliary tract obstruction. 3. Colonic diverticulosis. 4. Small right pleural effusion lying dependently. 5. Additional incidental findings, as above. Electronically Signed   By: Vinnie Langton M.D.   On: 02/12/2020 09:51   DG Chest Special View  Result Date: 02/06/2020 CLINICAL DATA:  Nodular densities in the lung bases. EXAM: CHEST SPECIAL VIEW COMPARISON:  Chest x-ray 02/06/2020 FINDINGS: Trachea midline. Cardiomediastinal contours and hilar structures are normal. Lungs are clear. Nipple markers have been placed over nodular densities at the lung bases compatible with nipple shadows. Visualized skeletal structures on limited assessment without acute process. IMPRESSION: 1. No acute cardiopulmonary  disease. 2. Nipple shadows at the lung bases. Electronically Signed   By: Zetta Bills M.D.   On: 02/06/2020 08:36   DG Chest Port 1 View  Result Date: 02/06/2020 CLINICAL DATA:  Weakness EXAM: PORTABLE CHEST 1 VIEW COMPARISON:  10/12/2006 FINDINGS: The heart size and mediastinal contours are within normal limits. Rounded 8 mm nodular densities project over the peripheral aspects of the bilateral lung bases, favored to represent nipple shadows. The lungs are otherwise clear. No focal airspace consolidation, pleural effusion, or pneumothorax. The visualized skeletal structures are unremarkable. IMPRESSION: 1. No active cardiopulmonary disease. 2. Probable nipple shadows at the bilateral lung bases. Repeat  radiograph with nipple markers could be performed to confirm. Electronically Signed   By: Davina Poke D.O.   On: 02/06/2020 08:00   DG ABD ACUTE 2+V W 1V CHEST  Result Date: 02/10/2020 CLINICAL DATA:  Leukocytosis. EXAM: DG ABDOMEN ACUTE W/ 1V CHEST COMPARISON:  Chest radiograph February 06, 2020. FINDINGS: AP chest: Lungs are clear. Heart size and pulmonary vascularity are normal. No adenopathy. Supine and upright abdomen: There is diffuse stool throughout the colon. There is no bowel dilatation or air-fluid level to suggest bowel obstruction. No free air. No abnormal calcifications. IMPRESSION: Diffuse stool throughout colon. No bowel obstruction or free air. Lungs clear. Electronically Signed   By: Lowella Grip III M.D.   On: 02/10/2020 08:32   DG Humerus Right  Result Date: 02/06/2020 CLINICAL DATA:  Right arm pain common no known injury, initial encounter EXAM: RIGHT HUMERUS - 2+ VIEW COMPARISON:  None. FINDINGS: No acute fracture or dislocation is noted. Degenerative changes of the acromioclavicular joint are seen. Olecranon spurring is noted as well. IMPRESSION: No acute abnormality noted. Electronically Signed   By: Inez Catalina M.D.   On: 02/06/2020 13:27   MR ABDOMEN MRCP W WO CONTAST  Result Date: 02/12/2020 CLINICAL DATA:  86 year old male with history of cholelithiasis. Evaluate for possible cholecystitis. Indeterminate hepatic lesions noted on ultrasound examination. EXAM: MRI ABDOMEN WITHOUT AND WITH CONTRAST (INCLUDING MRCP) TECHNIQUE: Multiplanar multisequence MR imaging of the abdomen was performed both before and after the administration of intravenous contrast. Heavily T2-weighted images of the biliary and pancreatic ducts were obtained, and three-dimensional MRCP images were rendered by post processing. CONTRAST:  79mL GADAVIST GADOBUTROL 1 MMOL/ML IV SOLN COMPARISON:  No prior abdominal MRI. Abdominal ultrasound 02/10/2020. FINDINGS: Lower chest: Small right pleural effusion  lying dependently. Some associated dependent subsegmental atelectasis in the right lower lobe. Hepatobiliary: There is a large infiltrative mass centered predominantly in the right lobe of the liver estimated to measure approximately 13.9 x 8.8 x 9.9 cm (axial image 16 of series 4 and coronal image 16 of series 5) with multiple other smaller satellite lesions, most of which are clustered in segment 4A of the liver. These lesions all demonstrate low T1 signal intensity, high T2 signal intensity, diffusion restriction, and demonstrate heterogeneous enhancement on post gadolinium imaging. No intra or extrahepatic biliary ductal dilatation noted on MRCP images. Common bile duct measures 4 mm in the porta hepatis. No filling defect in the common bile duct to suggest choledocholithiasis. There are numerous small filling defects within the lumen of the gallbladder, compatible with gallstones. Gallbladder appears completely contracted around these indwelling stones. No findings to suggest an acute cholecystitis at this time. Pancreas: No pancreatic mass. No pancreatic ductal dilatation noted on MRCP images. No pancreatic or peripancreatic fluid collections or inflammatory changes. Spleen:  Unremarkable. Adrenals/Urinary Tract: Multiple T1 hypointense, T2  hyperintense, nonenhancing lesions in both kidneys compatible with simple cysts, largest of which measures 3.2 cm in the upper pole of the left kidney. Additionally, in the lateral aspect of the lower pole of the left kidney (axial image 49 of series 1122) there is a T1 hyperintense, T2 hypointense, nonenhancing lesion, compatible with a small proteinaceous/hemorrhagic cyst. No suspicious renal lesions. No hydroureteronephrosis in the visualized portions of the abdomen. Bilateral adrenal glands are normal in appearance. Stomach/Bowel: Stomach is unremarkable in appearance. Visualized portions of small bowel and colon do not appear dilated. Numerous colonic diverticulae.  Vascular/Lymphatic: No aneurysm noted in the abdominal vasculature. No lymphadenopathy noted in the abdomen. Other:  Trace volume of ascites. Musculoskeletal: No aggressive appearing osseous lesions are noted in the visualized portions of the skeleton. IMPRESSION: 1. Innumerable malignant appearing lesions in the hepatic parenchyma, most notable for a dominant mass in the right lobe of the liver which currently measures 13.9 x 8.8 x 9.9 cm. The imaging characteristics of these lesions are nonspecific, but appear grass of (presumably malignant). Correlation with biopsy is recommended to establish a tissue diagnosis if clinically appropriate. 2. Cholelithiasis without evidence of acute cholecystitis at this time. No choledocholithiasis or signs of biliary tract obstruction. 3. Colonic diverticulosis. 4. Small right pleural effusion lying dependently. 5. Additional incidental findings, as above. Electronically Signed   By: Vinnie Langton M.D.   On: 02/12/2020 09:51   US Abdomen Limited RUQ  Result Date: 02/03/2020 CLINICAL DATA:  Abdominal EXAM: ULTRASOUND ABDOMEN LIMITED RIGHT UPPER QUADRANT COMPARISON:  None. FINDINGS: Gallbladder: Limited visualization of the gallbladder is noted. No definitive stones are seen. Common bile duct: Diameter: 3 mm Liver: Liver is heterogeneous with hyperechoic lesions the largest of which lies in the posteroinferior aspect of the right lobe of the liver measuring 6.3 cm in greatest dimension. These changes are suspicious for multiple hemangiomas. Portal vein is patent on color Doppler imaging with normal direction of blood flow towards the liver. Other: None. IMPRESSION: Incomplete visualization of the gallbladder. Hyperechoic areas within the liver suspicious for hemangiomas. These are incompletely evaluated on this exam. Nonemergent MRI performed as an outpatient may be helpful for further evaluation. Electronically Signed   By: Inez Catalina M.D.   On: 02/03/2020 15:06     Labs:  CBC: Recent Labs    02/07/20 0602 02/10/20 0412 02/11/20 0411 02/12/20 0413  WBC 6.4 11.4* 11.1* 10.1  HGB 11.0* 10.6* 10.9* 10.6*  HCT 34.3* 32.8* 33.6* 32.6*  PLT 131* 136* 152 162    COAGS: Recent Labs    02/07/20 0602  INR 1.3*    BMP: Recent Labs    02/11/20 0411 02/12/20 0413 02/13/20 0401 02/13/20 0805  NA 136 135 135 136  K 4.0 3.7 3.5 4.3  CL 99 102 103 103  CO2 24 22 22 22   GLUCOSE 158* 141* 110* 118*  BUN 35* 33* 38* 38*  CALCIUM 9.3 8.8* 8.6* 8.6*  CREATININE 1.65* 1.68* 1.88* 1.98*  GFRNONAA 36* 35* 31* 29*  GFRAA 42* 41* 36* 34*    LIVER FUNCTION TESTS: Recent Labs    02/11/20 0411 02/12/20 0413 02/13/20 0401 02/13/20 0805  BILITOT 1.1 1.1 0.9 1.1  AST 63* 56* 80* 95*  ALT 36 31 32 33  ALKPHOS 328* 322* 356* 381*  PROT 7.3 6.6 6.1* 6.3*  ALBUMIN 2.8* 2.6* 2.4* 2.5*    TUMOR MARKERS: No results for input(s): AFPTM, CEA, CA199, CHROMGRNA in the last 8760 hours.  Assessment and Plan:  86 y.o, male inpatient (AP). History of dementia, CKD, HTN. Admitted for weakness and malaise and abdominal pain found to have a hepatic mass. Team is requesting a liver biopsy for further determination of  Pathology. Patient has a MR Abdomen from 7.29.21 which reads Innumerable malignant appearing lesions in the hepatic parenchyma, most notable for a dominant mass in the right lobe of the liver which currently measures 13.9 x 8.8 x 9.9 cm. The imaging characteristics of these lesions are nonspecific, but appear grass of (presumably malignant). Correlation with biopsy is recommended to establish a tissue diagnosis if clinically appropriative. Patient has no known allergies.  are within acceptable parameters. Patient is on subcutaneous prophylactic dose of lovenox  Currently being held.  Patient is afebrile.   Risks and benefits of liver was discussed with the patient's wife via telephone including, but not limited to bleeding, infection,  damage to adjacent structures or low yield requiring additional tests.  All of the questions were answered and there is agreement to proceed.  Consent signed and in chart.   Thank you for this interesting consult.  I greatly enjoyed meeting PACEY ALTIZER and look forward to participating in their care.  A copy of this report was sent to the requesting provider on this date.  Electronically Signed: Jacqualine Mau, NP 02/13/2020, 9:46 AM   I spent a total of 40 Minutes in face to face in clinical consultation, greater than 50% of which was counseling/coordinating care for liver biopsy

## 2020-02-13 NOTE — Sedation Documentation (Signed)
Taken to nurses station to wait for transport back to [facility]

## 2020-02-13 NOTE — Sedation Documentation (Signed)
02 d/c 

## 2020-02-13 NOTE — TOC Transition Note (Addendum)
Transition of Care [facility]) - CM/SW Discharge Note   Patient Details  Name: Jerome Chambers MRN: [id number] Date of Birth: 23-Jul-1928  [facility] CM/SW Contact:  Natasha Bence, LCSW Phone Number: 02/13/2020, 2:30 PM   Clinical Narrative:    CSW spoke with family about patient's readiness for discharge. Family agreeable for discharge but expressed concerns about discharge due to patient's mobility. CSW discussed with family that SNF is a more appropriate setting to address ambulation and occupational therapy needs. Family agreeable to be discharged to [facility]. Family requested a copy of discharge summary. CSW provided discharge summary to patient's wife. [place] agreeable to take patient 07/30. CSW notified [facility] of patient's discharge and provided patient's auth number. TOC signing off.    Final next level of care: Skilled Nursing Facility Barriers to Discharge: Continued Medical Work up   Patient Goals and CMS Choice Patient states their goals for this hospitalization and ongoing recovery are:: Discharge home after SNF CMS Medicare.gov Compare Post Acute Care list provided to:: Patient Represenative (must comment) Felizardo Hoffmann (wife)) Choice offered to / list presented to : Spouse  Discharge Placement                       Discharge Plan and Services In-house Referral: Clinical Social Work Discharge Planning Services: NA Post Acute Care Choice: Skilled Nursing Facility                               Social Determinants of Health (SDOH) Interventions     Readmission Risk Interventions No flowsheet data found.

## 2020-02-13 NOTE — Procedures (Signed)
Interventional Radiology Procedure Note  Procedure: US guided biopsy of liver mass, right liver  Complications: None EBL: None Recommendations: - Bedrest 2 hours.   - Routine wound care - Follow up pathology - Ok to advance diet per primary order  Signed,  Corrie Mckusick, DO

## 2020-02-13 NOTE — Discharge Summary (Addendum)
Physician Discharge Summary  Jerome Chambers UUE:[id number] DOB: 07-12-1929 DOA: 02/06/2020  PCP: Rosita Fire, MD  Admit date: 02/06/2020  Discharge date: 02/13/2020  Admitted From:Home  Disposition:  SNF  Recommendations for Outpatient Follow-up:  1. Follow up with PCP in 1-2 weeks 2. Please obtain BMP/CBC in one week 3. Continue on medications as noted below 4. Follow-up with Dr. Delton Coombes to review liver biopsy results in 1 week  Home Health: None  Equipment/Devices: None  Discharge Condition: Stable  CODE STATUS: Full  Diet recommendation: Heart Healthy/carb modified with supplementation  Brief/Interim Summary: 86 y.o.malewith medical history significant ofdementia, stage 3b CKD, type 2 diabetes mellitus, OSA not on CPAP, HTN, glaucoma whohas been having symptoms of weakness and malaise over the past week. He has been working with his primary care physician and they have been evaluating him for gallbladder disease. He had an episode yesterday where he stood up from a seated position and passed out. He fell on his right arm. He was unable to stand up after that. His wife had to get assistance to get him up off the floor. He had complained of some right arm pain other wise he had no complaints. He is still having difficulty ambulating. His wife reports that he has been fully vaccinated for COVID-19.  -Patient was admitted with urinary tract infection, but had insignificant growth and urine cultures.  He was also noted to have dehydration with AKI on CKD stage IIIb which has now returned to baseline after IV fluid hydration.  He was noted to have persistent abdominal pain with cholelithiasis and had undergone further imaging now revealing liver masses for which he underwent biopsy on 7/30 with results pending.  He overall appears stable for discharge to rehabilitation facility, but prognosis appears quite poor as he is not eating very well.  He may likely be a candidate for  hospice in the very near future, but family would like to discuss treatment options with oncology prior to making that decision which appears appropriate.  1. AKI on stage 3b CKD -resolved and patient is back to baseline creatinine between 1.7-2. 2. Abdominal pain with cholelithiasis and liver masses. Continues to have ongoing transaminitis and poor dietary tolerance. Abdominal ultrasound performed 7/27 with recommendation for MRI.   MRI results with significant liver masses noted on 7/29.  Patient has undergone liver biopsy on 9/15 with no complications noted.  Results pending and he will need follow-up with oncology to review results and possible treatment options.  Unfortunately, he already appears to have a poor prognosis.  Continue dietary supplementation. 3. Cystitis - TREATED. Pt presented with urinary findings of acute cystitis. Continue ceftriaxone IV and follow urine culture results. UC insignificant growth.  4. Thrombocytopenia - improved, platelets up to 162,000. Continue to monitor with repeat CBC 5. Type 2 diabetes mellitus -now poorly tolerating diet.  Continue to monitor in outpatient setting.  Hold oral agents. 6. Dementia -continue to monitor for delirium.  7. Gait instability / generalized weakness - PT evaluation recommending SNF placement, TOC consulted to assist with placement. Now with bed available at [facility].  Stable for discharge. 8. Right arm pain from fall - xray negative for fracture. Tylenol as needed for pain.  No significant pain noted overall, and patient has appeared comfortable from the standpoint. 9. HTN -amlodipine increased to 5 mg daily during this admission.  Continue to monitor blood pressures closely which are anticipated to drop given the fact that he has poor oral intake.  10. Constipation -continue stool softeners as ordered.  Discharge Diagnoses:  Principal Problem:   AKI (acute kidney injury) ([place]) Active Problems:   Unspecified  glaucoma   Essential hypertension   Allergic rhinitis   Osteoarthritis   Senile dementia (HCC)   Type 2 diabetes mellitus without complication (HCC)   Acute renal failure superimposed on stage 3 chronic kidney disease (HCC)   UTI (urinary tract infection)   Cystitis   Thrombocytopenia (HCC)   Anemia in chronic kidney disease (CKD)   Gait instability   At risk for falling   RUQ abdominal pain   Calculus of gallbladder without cholecystitis without obstruction    Discharge Instructions  Discharge Instructions    Diet - low sodium heart healthy   Complete by: As directed    If the dressing is still on your incision site when you go home, remove it on the third day after your surgery date. Remove dressing if it begins to fall off, or if it is dirty or damaged before the third day.   Complete by: As directed    Increase activity slowly   Complete by: As directed      Allergies as of 02/13/2020   No Known Allergies     Medication List    STOP taking these medications   glimepiride 2 MG tablet Commonly known as: AMARYL     TAKE these medications   amLODipine 2.5 MG tablet Commonly known as: NORVASC Take 2.5 mg by mouth daily.   aspirin 81 MG tablet Take 81 mg by mouth daily.   econazole nitrate 1 % cream Apply 1 application topically daily as needed (toe).   ferrous gluconate 324 MG tablet Commonly known as: FERGON Take 324 mg by mouth daily with breakfast.   latanoprost 0.005 % ophthalmic solution Commonly known as: XALATAN Place 1 drop into both eyes every morning.   multivitamin with minerals Tabs tablet Take 1 tablet by mouth daily.   ondansetron 4 MG tablet Commonly known as: ZOFRAN Take 1 tablet (4 mg total) by mouth every 6 (six) hours as needed for nausea.   oxyCODONE 5 MG immediate release tablet Commonly known as: Roxicodone Take 1 tablet (5 mg total) by mouth every 4 (four) hours as needed for moderate pain, severe pain or breakthrough pain.    senna-docusate 8.6-50 MG tablet Commonly known as: Senokot-S Take 1 tablet by mouth 2 (two) times daily.   terbinafine 1 % cream Commonly known as: LAMISIL Apply topically daily as needed (toe).   traZODone 50 MG tablet Commonly known as: DESYREL Take 0.5 tablets (25 mg total) by mouth at bedtime as needed for sleep.   Vitamin D3 25 MCG (1000 UT) Caps Take 1 capsule by mouth in the morning.            Durable Medical Equipment  (From admission, onward)         Start     Ordered   02/09/20 1259  For home use only DME Walker rolling  Once       Question Answer Comment  Walker: With Carefree Wheels   Patient needs a walker to treat with the following condition Gait instability      02/09/20 1258           Discharge Care Instructions  (From admission, onward)         Start     Ordered   02/13/20 0000  If the dressing is still on your incision site when you  go home, remove it on the third day after your surgery date. Remove dressing if it begins to fall off, or if it is dirty or damaged before the third day.        02/13/20 1523          Contact information for follow-up providers    Rosita Fire, MD Follow up in 2 week(s).   Specialty: Internal Medicine Contact information: [place] [address] [phone]        Derek Jack, MD Follow up in 1 week(s).   Specialty: Hematology Contact information: [address] [place] [address] [phone]            Contact information for after-discharge care    [facility] .   Service: Skilled Nursing Contact information: [address] [place] [phone]                 No Known Allergies  Consultations:  None   Procedures/Studies: US Abdomen Complete  Result Date: 02/10/2020 CLINICAL DATA:  Right upper quadrant abdominal pain. Cholelithiasis. EXAM: ABDOMEN ULTRASOUND  COMPLETE COMPARISON:  Right upper quadrant ultrasound 1 week ago 02/03/2020 FINDINGS: Gallbladder: Gallbladder is difficult to visualize. Suspected gallbladder filled with stones creating a wall echo shadow complex with posterior shadowing. No sonographic Murphy sign noted by sonographer. Common bile duct: Diameter: 3-4 mm, normal. Liver: Heterogeneous in parenchymal echogenicity. Hyperechoic lesion measuring 1.2 cm is typical of hemangioma. The larger lesion on prior ultrasound is not well-defined on the current exam, currently blends with heterogeneous adjacent parenchyma. Portal vein is patent on color Doppler imaging with normal direction of blood flow towards the liver. IVC: No abnormality visualized. Pancreas: Visualized portion unremarkable. Spleen: Normal in size. Few scattered echogenic foci may represent granulomas. Right Kidney: Length: 8.5 cm. Echogenicity within normal limits. The lower pole is partially obscured by shadowing bowel gas. No mass or hydronephrosis visualized. Left Kidney: Length: 9.7 cm. Echogenicity within normal limits. Elise 2 cysts largest measuring 4 cm in the upper pole. No solid mass or hydronephrosis visualized. Abdominal aorta: No aneurysm visualized. Other findings: Trace ascites. Suspected small right pleural effusion. IMPRESSION: 1. Gallbladder is difficult to delineate, however may be contracted and filled with stones creating a wall echo shadow complex. No sonographic Murphy sign. If there is clinical concern for acute cholecystitis, consider further evaluation with nuclear medicine HIDA scan. 2. No biliary dilatation. 3. Heterogeneous hepatic parenchyma. Small hyperechoic lesion is likely hemangioma. The larger lesion on prior ultrasound is not well-defined on the current exam and currently blends with adjacent heterogeneous parenchyma. This could be further evaluated with hepatic protocol MRI on an elective basis. 4. Trace abdominal ascites. Electronically Signed   By:  Keith Rake M.D.   On: 02/10/2020 16:00   MR 3D Recon At Scanner  Result Date: 02/12/2020 CLINICAL DATA:  86 year old male with history of cholelithiasis. Evaluate for possible cholecystitis. Indeterminate hepatic lesions noted on ultrasound examination. EXAM: MRI ABDOMEN WITHOUT AND WITH CONTRAST (INCLUDING MRCP) TECHNIQUE: Multiplanar multisequence MR imaging of the abdomen was performed both before and after the administration of intravenous contrast. Heavily T2-weighted images of the biliary and pancreatic ducts were obtained, and three-dimensional MRCP images were rendered by post processing. CONTRAST:  65mL GADAVIST GADOBUTROL 1 MMOL/ML IV SOLN COMPARISON:  No prior abdominal MRI. Abdominal ultrasound 02/10/2020. FINDINGS: Lower chest: Small right pleural effusion lying dependently. Some associated dependent subsegmental atelectasis in the right  lower lobe. Hepatobiliary: There is a large infiltrative mass centered predominantly in the right lobe of the liver estimated to measure approximately 13.9 x 8.8 x 9.9 cm (axial image 16 of series 4 and coronal image 16 of series 5) with multiple other smaller satellite lesions, most of which are clustered in segment 4A of the liver. These lesions all demonstrate low T1 signal intensity, high T2 signal intensity, diffusion restriction, and demonstrate heterogeneous enhancement on post gadolinium imaging. No intra or extrahepatic biliary ductal dilatation noted on MRCP images. Common bile duct measures 4 mm in the porta hepatis. No filling defect in the common bile duct to suggest choledocholithiasis. There are numerous small filling defects within the lumen of the gallbladder, compatible with gallstones. Gallbladder appears completely contracted around these indwelling stones. No findings to suggest an acute cholecystitis at this time. Pancreas: No pancreatic mass. No pancreatic ductal dilatation noted on MRCP images. No pancreatic or peripancreatic fluid  collections or inflammatory changes. Spleen:  Unremarkable. Adrenals/Urinary Tract: Multiple T1 hypointense, T2 hyperintense, nonenhancing lesions in both kidneys compatible with simple cysts, largest of which measures 3.2 cm in the upper pole of the left kidney. Additionally, in the lateral aspect of the lower pole of the left kidney (axial image 49 of series 1122) there is a T1 hyperintense, T2 hypointense, nonenhancing lesion, compatible with a small proteinaceous/hemorrhagic cyst. No suspicious renal lesions. No hydroureteronephrosis in the visualized portions of the abdomen. Bilateral adrenal glands are normal in appearance. Stomach/Bowel: Stomach is unremarkable in appearance. Visualized portions of small bowel and colon do not appear dilated. Numerous colonic diverticulae. Vascular/Lymphatic: No aneurysm noted in the abdominal vasculature. No lymphadenopathy noted in the abdomen. Other:  Trace volume of ascites. Musculoskeletal: No aggressive appearing osseous lesions are noted in the visualized portions of the skeleton. IMPRESSION: 1. Innumerable malignant appearing lesions in the hepatic parenchyma, most notable for a dominant mass in the right lobe of the liver which currently measures 13.9 x 8.8 x 9.9 cm. The imaging characteristics of these lesions are nonspecific, but appear grass of (presumably malignant). Correlation with biopsy is recommended to establish a tissue diagnosis if clinically appropriate. 2. Cholelithiasis without evidence of acute cholecystitis at this time. No choledocholithiasis or signs of biliary tract obstruction. 3. Colonic diverticulosis. 4. Small right pleural effusion lying dependently. 5. Additional incidental findings, as above. Electronically Signed   By: Vinnie Langton M.D.   On: 02/12/2020 09:51   US BIOPSY (LIVER)  Result Date: 02/13/2020 INDICATION: 86 year old male with a history right liver mass, referred for biopsy EXAM: ULTRASOUND-GUIDED LIVER MASS BIOPSY  MEDICATIONS: None. ANESTHESIA/SEDATION: Moderate (conscious) sedation was employed during this procedure. A total of Versed 0.5 mg and Fentanyl 25 mcg was administered intravenously. Moderate Sedation Time: 10 minutes. The patient's level of consciousness and vital signs were monitored continuously by radiology nursing throughout the procedure under my direct supervision. FLUOROSCOPY TIME:  None COMPLICATIONS: None PROCEDURE: Informed written consent was obtained from the patient's family after a thorough discussion of the procedural risks, benefits and alternatives. All questions were addressed. Maximal Sterile Barrier Technique was utilized including caps, mask, sterile gowns, sterile gloves, sterile drape, hand hygiene and skin antiseptic. A timeout was performed prior to the initiation of the procedure. Ultrasound survey of the right liver lobe performed with images stored and sent to PACs. The right lower thorax/right upper abdomen was prepped with chlorhexidine in a sterile fashion, and a sterile drape was applied covering the operative field. A sterile gown and sterile gloves  were used for the procedure. Local anesthesia was provided with 1% Lidocaine. The patient was prepped and draped sterilely and the skin and subcutaneous tissues were generously infiltrated with 1% lidocaine. A 17 gauge introducer needle was then advanced under ultrasound guidance in an intercostal location into the right liver lobe, targeting right liver mass. The stylet was removed, and multiple separate 18 gauge core biopsy were retrieved. Samples were placed into formalin for transportation to the lab. Gel-Foam pledgets were then infused with a small amount of saline for assistance with hemostasis. The needle was removed, and a final ultrasound image was performed. The patient tolerated the procedure well and remained hemodynamically stable throughout. No complications were encountered and no significant blood loss was encounter.  IMPRESSION: Status post image guided biopsy of right liver mass. Tissue specimen sent to pathology for complete histopathologic analysis. Signed, Dulcy Fanny. Dellia Nims, RPVI [facility] Electronically Signed   By: Corrie Mckusick D.O.   On: 02/13/2020 11:09   DG Chest Special View  Result Date: 02/06/2020 CLINICAL DATA:  Nodular densities in the lung bases. EXAM: CHEST SPECIAL VIEW COMPARISON:  Chest x-ray 02/06/2020 FINDINGS: Trachea midline. Cardiomediastinal contours and hilar structures are normal. Lungs are clear. Nipple markers have been placed over nodular densities at the lung bases compatible with nipple shadows. Visualized skeletal structures on limited assessment without acute process. IMPRESSION: 1. No acute cardiopulmonary disease. 2. Nipple shadows at the lung bases. Electronically Signed   By: Zetta Bills M.D.   On: 02/06/2020 08:36   DG Chest Port 1 View  Result Date: 02/06/2020 CLINICAL DATA:  Weakness EXAM: PORTABLE CHEST 1 VIEW COMPARISON:  10/12/2006 FINDINGS: The heart size and mediastinal contours are within normal limits. Rounded 8 mm nodular densities project over the peripheral aspects of the bilateral lung bases, favored to represent nipple shadows. The lungs are otherwise clear. No focal airspace consolidation, pleural effusion, or pneumothorax. The visualized skeletal structures are unremarkable. IMPRESSION: 1. No active cardiopulmonary disease. 2. Probable nipple shadows at the bilateral lung bases. Repeat radiograph with nipple markers could be performed to confirm. Electronically Signed   By: Davina Poke D.O.   On: 02/06/2020 08:00   DG ABD ACUTE 2+V W 1V CHEST  Result Date: 02/10/2020 CLINICAL DATA:  Leukocytosis. EXAM: DG ABDOMEN ACUTE W/ 1V CHEST COMPARISON:  Chest radiograph February 06, 2020. FINDINGS: AP chest: Lungs are clear. Heart size and pulmonary vascularity are normal. No adenopathy. Supine and upright  abdomen: There is diffuse stool throughout the colon. There is no bowel dilatation or air-fluid level to suggest bowel obstruction. No free air. No abnormal calcifications. IMPRESSION: Diffuse stool throughout colon. No bowel obstruction or free air. Lungs clear. Electronically Signed   By: Lowella Grip III M.D.   On: 02/10/2020 08:32   DG Humerus Right  Result Date: 02/06/2020 CLINICAL DATA:  Right arm pain common no known injury, initial encounter EXAM: RIGHT HUMERUS - 2+ VIEW COMPARISON:  None. FINDINGS: No acute fracture or dislocation is noted. Degenerative changes of the acromioclavicular joint are seen. Olecranon spurring is noted as well. IMPRESSION: No acute abnormality noted. Electronically Signed   By: Inez Catalina M.D.   On: 02/06/2020 13:27   MR ABDOMEN MRCP W WO CONTAST  Result Date: 02/12/2020 CLINICAL DATA:  86 year old male with history of cholelithiasis. Evaluate for possible cholecystitis. Indeterminate hepatic lesions noted on ultrasound examination. EXAM: MRI ABDOMEN WITHOUT AND WITH CONTRAST (INCLUDING MRCP) TECHNIQUE: Multiplanar multisequence MR imaging of  the abdomen was performed both before and after the administration of intravenous contrast. Heavily T2-weighted images of the biliary and pancreatic ducts were obtained, and three-dimensional MRCP images were rendered by post processing. CONTRAST:  36mL GADAVIST GADOBUTROL 1 MMOL/ML IV SOLN COMPARISON:  No prior abdominal MRI. Abdominal ultrasound 02/10/2020. FINDINGS: Lower chest: Small right pleural effusion lying dependently. Some associated dependent subsegmental atelectasis in the right lower lobe. Hepatobiliary: There is a large infiltrative mass centered predominantly in the right lobe of the liver estimated to measure approximately 13.9 x 8.8 x 9.9 cm (axial image 16 of series 4 and coronal image 16 of series 5) with multiple other smaller satellite lesions, most of which are clustered in segment 4A of the liver. These  lesions all demonstrate low T1 signal intensity, high T2 signal intensity, diffusion restriction, and demonstrate heterogeneous enhancement on post gadolinium imaging. No intra or extrahepatic biliary ductal dilatation noted on MRCP images. Common bile duct measures 4 mm in the porta hepatis. No filling defect in the common bile duct to suggest choledocholithiasis. There are numerous small filling defects within the lumen of the gallbladder, compatible with gallstones. Gallbladder appears completely contracted around these indwelling stones. No findings to suggest an acute cholecystitis at this time. Pancreas: No pancreatic mass. No pancreatic ductal dilatation noted on MRCP images. No pancreatic or peripancreatic fluid collections or inflammatory changes. Spleen:  Unremarkable. Adrenals/Urinary Tract: Multiple T1 hypointense, T2 hyperintense, nonenhancing lesions in both kidneys compatible with simple cysts, largest of which measures 3.2 cm in the upper pole of the left kidney. Additionally, in the lateral aspect of the lower pole of the left kidney (axial image 49 of series 1122) there is a T1 hyperintense, T2 hypointense, nonenhancing lesion, compatible with a small proteinaceous/hemorrhagic cyst. No suspicious renal lesions. No hydroureteronephrosis in the visualized portions of the abdomen. Bilateral adrenal glands are normal in appearance. Stomach/Bowel: Stomach is unremarkable in appearance. Visualized portions of small bowel and colon do not appear dilated. Numerous colonic diverticulae. Vascular/Lymphatic: No aneurysm noted in the abdominal vasculature. No lymphadenopathy noted in the abdomen. Other:  Trace volume of ascites. Musculoskeletal: No aggressive appearing osseous lesions are noted in the visualized portions of the skeleton. IMPRESSION: 1. Innumerable malignant appearing lesions in the hepatic parenchyma, most notable for a dominant mass in the right lobe of the liver which currently measures 13.9  x 8.8 x 9.9 cm. The imaging characteristics of these lesions are nonspecific, but appear grass of (presumably malignant). Correlation with biopsy is recommended to establish a tissue diagnosis if clinically appropriate. 2. Cholelithiasis without evidence of acute cholecystitis at this time. No choledocholithiasis or signs of biliary tract obstruction. 3. Colonic diverticulosis. 4. Small right pleural effusion lying dependently. 5. Additional incidental findings, as above. Electronically Signed   By: Vinnie Langton M.D.   On: 02/12/2020 09:51   US Abdomen Limited RUQ  Result Date: 02/03/2020 CLINICAL DATA:  Abdominal EXAM: ULTRASOUND ABDOMEN LIMITED RIGHT UPPER QUADRANT COMPARISON:  None. FINDINGS: Gallbladder: Limited visualization of the gallbladder is noted. No definitive stones are seen. Common bile duct: Diameter: 3 mm Liver: Liver is heterogeneous with hyperechoic lesions the largest of which lies in the posteroinferior aspect of the right lobe of the liver measuring 6.3 cm in greatest dimension. These changes are suspicious for multiple hemangiomas. Portal vein is patent on color Doppler imaging with normal direction of blood flow towards the liver. Other: None. IMPRESSION: Incomplete visualization of the gallbladder. Hyperechoic areas within the liver suspicious for hemangiomas. These are  incompletely evaluated on this exam. Nonemergent MRI performed as an outpatient may be helpful for further evaluation. Electronically Signed   By: Inez Catalina M.D.   On: 02/03/2020 15:06    Discharge Exam: Vitals:   02/13/20 1045 02/13/20 1050  BP: 120/79 (!) 131/83  Pulse: 104 101  Resp: 18 16  Temp:    SpO2: 100% 96%   Vitals:   02/13/20 1023 02/13/20 1040 02/13/20 1045 02/13/20 1050  BP: (!) 134/83 125/79 120/79 (!) 131/83  Pulse: 102 103 104 101  Resp: 20 19 18 16   Temp:      TempSrc:      SpO2: 100% 100% 100% 96%  Weight:      Height:        General: Pt is somnolent Cardiovascular: RRR,  S1/S2 +, no rubs, no gallops Respiratory: CTA bilaterally, no wheezing, no rhonchi Abdominal: Soft, NT, ND, bowel sounds + Extremities: no edema, no cyanosis    The results of significant diagnostics from this hospitalization (including imaging, microbiology, ancillary and laboratory) are listed below for reference.     Microbiology: Recent Results (from the past 240 hour(s))  SARS Coronavirus 2 by RT PCR (hospital order, performed in [facility] hospital lab) Nasopharyngeal Nasopharyngeal Swab     Status: None   Collection Time: 02/06/20  8:10 AM   Specimen: Nasopharyngeal Swab  Result Value Ref Range Status   SARS Coronavirus 2 NEGATIVE NEGATIVE Final    Comment: (NOTE) SARS-CoV-2 target nucleic acids are NOT DETECTED.  The SARS-CoV-2 RNA is generally detectable in upper and lower respiratory specimens during the acute phase of infection. The lowest concentration of SARS-CoV-2 viral copies this assay can detect is 250 copies / mL. A negative result does not preclude SARS-CoV-2 infection and should not be used as the sole basis for treatment or other patient management decisions.  A negative result may occur with improper specimen collection / handling, submission of specimen other than nasopharyngeal swab, presence of viral mutation(s) within the areas targeted by this assay, and inadequate number of viral copies (<250 copies / mL). A negative result must be combined with clinical observations, patient history, and epidemiological information.  Fact Sheet for Patients:   StrictlyIdeas.no  Fact Sheet for Healthcare Providers: BankingDealers.co.za  This test is not yet approved or  cleared by the [place] FDA and has been authorized for detection and/or diagnosis of SARS-CoV-2 by FDA under an Emergency Use Authorization (EUA).  This EUA will remain in effect (meaning this test can be used) for the duration of the COVID-19  declaration under Section 564(b)(1) of the Act, 21 U.S.C. section 360bbb-3(b)(1), unless the authorization is terminated or revoked sooner.  Performed at [facility], [address], [place], [place] [address]   Urine Culture     Status: Abnormal   Collection Time: 02/06/20  9:27 AM   Specimen: Urine, Clean Catch  Result Value Ref Range Status   Specimen Description   Final    URINE, CLEAN CATCH Performed at [facility], [address] [place], [place] [address]    Special Requests   Final    NONE Performed at [facility], [address] [place], [place] [address]    Culture (A)  Final    <10,000 COLONIES/mL INSIGNIFICANT GROWTH Performed at [facility] [address], [place], [place] [address]    Report Status 02/08/2020 FINAL  Final  Surgical pcr screen     Status: None   Collection Time: 02/13/20  5:54 AM   Specimen: Nasal Mucosa; Nasal Swab  Result Value Ref Range Status   MRSA, PCR NEGATIVE NEGATIVE Final   Staphylococcus aureus NEGATIVE NEGATIVE Final    Comment: (NOTE) The Xpert SA Assay (FDA approved for NASAL specimens in patients 93 years of age and older), is one component of a comprehensive surveillance program. It is not intended to diagnose infection nor to guide or monitor treatment. Performed at [facility], [address] [place], [place] [address]      Labs: BNP (last 3 results) No results for input(s): BNP in the last 8760 hours. Basic Metabolic Panel: Recent Labs  Lab 02/08/20 0603 02/09/20 0459 02/10/20 0412 02/11/20 0411 02/12/20 0413 02/13/20 0401 02/13/20 0805  NA 137   < > 137 136 135 135 136  K 4.4   < > 4.9 4.0 3.7 3.5 4.3  CL 103   < > 103 99 102 103 103  CO2 23   < > 21* 24 22 22 22   GLUCOSE 115*   < > 160* 158* 141* 110* 118*  BUN 32*   < > 35* 35* 33* 38* 38*  CREATININE 1.99*   < > 1.87* 1.65* 1.68* 1.88* 1.98*  CALCIUM 9.2   < > 9.1 9.3 8.8* 8.6* 8.6*  MG 1.8  --   --  1.9 1.8 1.8 1.9   < > = values in this  interval not displayed.   Liver Function Tests: Recent Labs  Lab 02/10/20 0412 02/11/20 0411 02/12/20 0413 02/13/20 0401 02/13/20 0805  AST 83* 63* 56* 80* 95*  ALT 39 36 31 32 33  ALKPHOS 376* 328* 322* 356* 381*  BILITOT 1.3* 1.1 1.1 0.9 1.1  PROT 7.3 7.3 6.6 6.1* 6.3*  ALBUMIN 2.9* 2.8* 2.6* 2.4* 2.5*   No results for input(s): LIPASE, AMYLASE in the last 168 hours. No results for input(s): AMMONIA in the last 168 hours. CBC: Recent Labs  Lab 02/07/20 0602 02/10/20 0412 02/11/20 0411 02/12/20 0413  WBC 6.4 11.4* 11.1* 10.1  NEUTROABS 4.6 9.8* 9.6*  --   HGB 11.0* 10.6* 10.9* 10.6*  HCT 34.3* 32.8* 33.6* 32.6*  MCV 101.2* 101.9* 100.0 99.4  PLT 131* 136* 152 162   Cardiac Enzymes: No results for input(s): CKTOTAL, CKMB, CKMBINDEX, TROPONINI in the last 168 hours. BNP: Invalid input(s): POCBNP CBG: Recent Labs  Lab 02/12/20 1149 02/12/20 1644 02/12/20 2131 02/13/20 0731 02/13/20 1334  GLUCAP 93 109* 120* 107* 106*   D-Dimer No results for input(s): DDIMER in the last 72 hours. Hgb A1c No results for input(s): HGBA1C in the last 72 hours. Lipid Profile No results for input(s): CHOL, HDL, LDLCALC, TRIG, CHOLHDL, LDLDIRECT in the last 72 hours. Thyroid function studies No results for input(s): TSH, T4TOTAL, T3FREE, THYROIDAB in the last 72 hours.  Invalid input(s): FREET3 Anemia work up No results for input(s): VITAMINB12, FOLATE, FERRITIN, TIBC, IRON, RETICCTPCT in the last 72 hours. Urinalysis    Component Value Date/Time   COLORURINE AMBER (A) 02/06/2020 0810   APPEARANCEUR HAZY (A) 02/06/2020 0810   LABSPEC 1.013 02/06/2020 0810   PHURINE 5.0 02/06/2020 0810   GLUCOSEU NEGATIVE 02/06/2020 0810   HGBUR LARGE (A) 02/06/2020 0810   BILIRUBINUR NEGATIVE 02/06/2020 0810   KETONESUR 5 (A) 02/06/2020 0810   PROTEINUR >=300 (A) 02/06/2020 0810   NITRITE NEGATIVE 02/06/2020 0810   LEUKOCYTESUR NEGATIVE 02/06/2020 0810   Sepsis Labs Invalid input(s):  PROCALCITONIN,  WBC,  LACTICIDVEN Microbiology Recent Results (from the past 240  hour(s))  SARS Coronavirus 2 by RT PCR (hospital order, performed in [facility] hospital lab) Nasopharyngeal Nasopharyngeal Swab     Status: None   Collection Time: 02/06/20  8:10 AM   Specimen: Nasopharyngeal Swab  Result Value Ref Range Status   SARS Coronavirus 2 NEGATIVE NEGATIVE Final    Comment: (NOTE) SARS-CoV-2 target nucleic acids are NOT DETECTED.  The SARS-CoV-2 RNA is generally detectable in upper and lower respiratory specimens during the acute phase of infection. The lowest concentration of SARS-CoV-2 viral copies this assay can detect is 250 copies / mL. A negative result does not preclude SARS-CoV-2 infection and should not be used as the sole basis for treatment or other patient management decisions.  A negative result may occur with improper specimen collection / handling, submission of specimen other than nasopharyngeal swab, presence of viral mutation(s) within the areas targeted by this assay, and inadequate number of viral copies (<250 copies / mL). A negative result must be combined with clinical observations, patient history, and epidemiological information.  Fact Sheet for Patients:   StrictlyIdeas.no  Fact Sheet for Healthcare Providers: BankingDealers.co.za  This test is not yet approved or  cleared by the [place] FDA and has been authorized for detection and/or diagnosis of SARS-CoV-2 by FDA under an Emergency Use Authorization (EUA).  This EUA will remain in effect (meaning this test can be used) for the duration of the COVID-19 declaration under Section 564(b)(1) of the Act, 21 U.S.C. section 360bbb-3(b)(1), unless the authorization is terminated or revoked sooner.  Performed at [facility], [address], [place], [place] [address]   Urine Culture     Status: Abnormal   Collection Time: 02/06/20  9:27 AM    Specimen: Urine, Clean Catch  Result Value Ref Range Status   Specimen Description   Final    URINE, CLEAN CATCH Performed at [facility], [address] [place], [place] [address]    Special Requests   Final    NONE Performed at [facility], [address] [place], [place] [address]    Culture (A)  Final    <10,000 COLONIES/mL INSIGNIFICANT GROWTH Performed at [facility] [address] [place], [place] [address]    Report Status 02/08/2020 FINAL  Final  Surgical pcr screen     Status: None   Collection Time: 02/13/20  5:54 AM   Specimen: Nasal Mucosa; Nasal Swab  Result Value Ref Range Status   MRSA, PCR NEGATIVE NEGATIVE Final   Staphylococcus aureus NEGATIVE NEGATIVE Final    Comment: (NOTE) The Xpert SA Assay (FDA approved for NASAL specimens in patients 12 years of age and older), is one component of a comprehensive surveillance program. It is not intended to diagnose infection nor to guide or monitor treatment. Performed at [facility], [address], [place], [place] [address]      Time coordinating discharge: 35 minutes  SIGNED:   Rodena Goldmann, DO [facility] 02/13/2020, 4:20 PM  If 7PM-7AM, please contact night-coverage

## 2020-02-13 NOTE — Plan of Care (Signed)
  Problem: Education: Goal: Knowledge of General Education information will improve Description: Including pain rating scale, medication(s)/side effects and non-pharmacologic comfort measures 02/13/2020 1709 by Otis Peak, RN Outcome: Adequate for Discharge 02/13/2020 1709 by Otis Peak, RN Outcome: Adequate for Discharge   Problem: Health Behavior/Discharge Planning: Goal: Ability to manage health-related needs will improve 02/13/2020 1709 by Otis Peak, RN Outcome: Adequate for Discharge 02/13/2020 1709 by Otis Peak, RN Outcome: Adequate for Discharge   Problem: Clinical Measurements: Goal: Ability to maintain clinical measurements within normal limits will improve 02/13/2020 1709 by Otis Peak, RN Outcome: Adequate for Discharge 02/13/2020 1709 by Otis Peak, RN Outcome: Adequate for Discharge Goal: Will remain free from infection 02/13/2020 1709 by Otis Peak, RN Outcome: Adequate for Discharge 02/13/2020 1709 by Otis Peak, RN Outcome: Adequate for Discharge Goal: Diagnostic test results will improve 02/13/2020 1709 by Otis Peak, RN Outcome: Adequate for Discharge 02/13/2020 1709 by Otis Peak, RN Outcome: Adequate for Discharge Goal: Respiratory complications will improve 02/13/2020 1709 by Otis Peak, RN Outcome: Adequate for Discharge 02/13/2020 1709 by Otis Peak, RN Outcome: Adequate for Discharge Goal: Cardiovascular complication will be avoided 02/13/2020 1709 by Otis Peak, RN Outcome: Adequate for Discharge 02/13/2020 1709 by Otis Peak, RN Outcome: Adequate for Discharge   Problem: Activity: Goal: Risk for activity intolerance will decrease 02/13/2020 1709 by Otis Peak, RN Outcome: Adequate for Discharge 02/13/2020 1709 by Otis Peak, RN Outcome: Adequate for Discharge   Problem: Nutrition: Goal: Adequate nutrition will be maintained 02/13/2020 1709 by  Otis Peak, RN Outcome: Adequate for Discharge 02/13/2020 1709 by Otis Peak, RN Outcome: Adequate for Discharge   Problem: Coping: Goal: Level of anxiety will decrease 02/13/2020 1709 by Otis Peak, RN Outcome: Adequate for Discharge 02/13/2020 1709 by Otis Peak, RN Outcome: Adequate for Discharge   Problem: Elimination: Goal: Will not experience complications related to bowel motility 02/13/2020 1709 by Otis Peak, RN Outcome: Adequate for Discharge 02/13/2020 1709 by Otis Peak, RN Outcome: Adequate for Discharge Goal: Will not experience complications related to urinary retention 02/13/2020 1709 by Otis Peak, RN Outcome: Adequate for Discharge 02/13/2020 1709 by Otis Peak, RN Outcome: Adequate for Discharge   Problem: Pain Managment: Goal: General experience of comfort will improve 02/13/2020 1709 by Otis Peak, RN Outcome: Adequate for Discharge 02/13/2020 1709 by Otis Peak, RN Outcome: Adequate for Discharge   Problem: Safety: Goal: Ability to remain free from injury will improve 02/13/2020 1709 by Otis Peak, RN Outcome: Adequate for Discharge 02/13/2020 1709 by Otis Peak, RN Outcome: Adequate for Discharge   Problem: Skin Integrity: Goal: Risk for impaired skin integrity will decrease 02/13/2020 1709 by Otis Peak, RN Outcome: Adequate for Discharge 02/13/2020 1709 by Otis Peak, RN Outcome: Adequate for Discharge

## 2020-02-13 NOTE — Care Management Important Message (Signed)
Important Message  Patient Details  Name: Jerome Chambers MRN: [id number] Date of Birth: 1929/02/13   Medicare Important Message Given:  Yes     Tommy Medal 02/13/2020, 1:29 PM

## 2020-02-13 NOTE — Progress Notes (Signed)
Report called and given to [facility] LPN, [facility]. All questions were answered and no further questions at this time. Informed by nurse that patient will be transported after dinner at approximately 1830.

## 2020-02-16 ENCOUNTER — Encounter: Payer: Self-pay | Admitting: Adult Health

## 2020-02-16 ENCOUNTER — Other Ambulatory Visit: Payer: Self-pay | Admitting: Adult Health

## 2020-02-16 ENCOUNTER — Non-Acute Institutional Stay (SKILLED_NURSING_FACILITY): Payer: Medicare Other | Admitting: Adult Health

## 2020-02-16 DIAGNOSIS — R160 Hepatomegaly, not elsewhere classified: Secondary | ICD-10-CM

## 2020-02-16 DIAGNOSIS — F0390 Unspecified dementia without behavioral disturbance: Secondary | ICD-10-CM

## 2020-02-16 DIAGNOSIS — N1832 Chronic kidney disease, stage 3b: Secondary | ICD-10-CM

## 2020-02-16 DIAGNOSIS — D631 Anemia in chronic kidney disease: Secondary | ICD-10-CM

## 2020-02-16 DIAGNOSIS — R1011 Right upper quadrant pain: Secondary | ICD-10-CM | POA: Diagnosis not present

## 2020-02-16 DIAGNOSIS — N179 Acute kidney failure, unspecified: Secondary | ICD-10-CM

## 2020-02-16 DIAGNOSIS — I10 Essential (primary) hypertension: Secondary | ICD-10-CM | POA: Diagnosis not present

## 2020-02-16 DIAGNOSIS — G4733 Obstructive sleep apnea (adult) (pediatric): Secondary | ICD-10-CM

## 2020-02-16 DIAGNOSIS — D696 Thrombocytopenia, unspecified: Secondary | ICD-10-CM

## 2020-02-16 DIAGNOSIS — K8020 Calculus of gallbladder without cholecystitis without obstruction: Secondary | ICD-10-CM

## 2020-02-16 DIAGNOSIS — K5909 Other constipation: Secondary | ICD-10-CM

## 2020-02-16 DIAGNOSIS — H409 Unspecified glaucoma: Secondary | ICD-10-CM

## 2020-02-16 MED ORDER — OXYCODONE HCL 5 MG PO TABS: 5.0000 mg | ORAL_TABLET | Freq: Four times a day (QID) | ORAL | 0 refills | Status: DC

## 2020-02-16 NOTE — Progress Notes (Signed)
Location:    [facility] Room Number: 131/P Place of Service:  SNF (31)   CODE STATUS: Full Code  No Known Allergies  Chief Complaint  Patient presents with  . Hospitalization Follow-up    Hospitalization Follow Up    HPI:  He is a 86 year old man who has been hospitalized from 02-06-20 through 02-13-20. He had a fall with increased weakness at home. He was treated initially for acute cystitis but the culture did not have any significant growth. He has abdominal pain; was being worked up prior to his hospitalization; he was found to have liver masses with the largest in the right lobe 13.9  x 8.8 x 9.9 cm and malignant appearing. He has a poor po intake. He appears to have a poor prognosis and may benefit from hospice in the future. He does have abdominal pain. He is here for short term rehab with his goal to return back home. He will continue to be followed for his chronic illnesses including: hypertension; malnutrition; liver mass.   Past Medical History:  Diagnosis Date  . Allergic rhinitis, cause unspecified   . Dementia ([place])   . Diabetes ([place])   . Diverticulosis   . DJD (degenerative joint disease)   . Glaucoma   . History of Helicobacter pylori infection   . Sleep apnea    NO CPAP MACHINE   . Unspecified essential hypertension     No past surgical history on file.  Social History   Socioeconomic History  . Marital status: Married    Spouse name: Not on file  . Number of children: 3  . Years of education: Not on file  . Highest education level: Not on file  Occupational History  . Occupation: Retired   Tobacco Use  . Smoking status: Never Smoker  . Smokeless tobacco: Never Used  Vaping Use  . Vaping Use: Never used  Substance and Sexual Activity  . Alcohol use: No  . Drug use: No  . Sexual activity: Not on file  Other Topics Concern  . Not on file  Social History Narrative   Coffee daily    Social Determinants of Health    Financial Resource Strain:   . Difficulty of Paying Living Expenses:   Food Insecurity:   . Worried About Charity fundraiser in the Last Year:   . Arboriculturist in the Last Year:   Transportation Needs:   . Film/video editor (Medical):   Marland Kitchen Lack of Transportation (Non-Medical):   Physical Activity:   . Days of Exercise per Week:   . Minutes of Exercise per Session:   Stress:   . Feeling of Stress :   Social Connections:   . Frequency of Communication with Friends and Family:   . Frequency of Social Gatherings with Friends and Family:   . Attends Religious Services:   . Active Member of Clubs or Organizations:   . Attends Archivist Meetings:   Marland Kitchen Marital Status:   Intimate Partner Violence:   . Fear of Current or Ex-Partner:   . Emotionally Abused:   Marland Kitchen Physically Abused:   . Sexually Abused:    Family History  Problem Relation Age of Onset  . Colon cancer Neg Hx       VITAL SIGNS BP (!) 101/59   Pulse 87   Temp 97.6 F (36.4 C) (Oral)   Resp 20   Ht '5\' 11"'  (1.803 m)   Wt  169 lb 12 oz (77 kg)   BMI 23.68 kg/m   Outpatient Encounter Medications as of 02/16/2020  Medication Sig  . amLODipine (NORVASC) 2.5 MG tablet Take 2.5 mg by mouth daily.  Marland Kitchen aspirin 81 MG tablet Take 81 mg by mouth daily.  . Cholecalciferol (VITAMIN D3) 25 MCG (1000 UT) CAPS Take 1 capsule by mouth in the morning.  . ferrous gluconate (FERGON) 324 MG tablet Take 324 mg by mouth daily with breakfast.  . latanoprost (XALATAN) 0.005 % ophthalmic solution Place 1 drop into both eyes every morning.  . Multiple Vitamin (MULTIVITAMIN WITH MINERALS) TABS tablet Take 1 tablet by mouth daily.  . NON FORMULARY Diet: _____ Regular, __x____ NAS, ___x____Consistent Carbohydrate, _______NPO _____Other  . ondansetron (ZOFRAN) 4 MG tablet Take 1 tablet (4 mg total) by mouth every 6 (six) hours as needed for nausea.  Marland Kitchen oxyCODONE (OXY IR/ROXICODONE) 5 MG immediate release tablet Take 5 mg by  mouth every 4 (four) hours as needed for severe pain. For moderate pain and severe pain or breakthrough pain  . senna-docusate (SENOKOT-S) 8.6-50 MG tablet Take 1 tablet by mouth 2 (two) times daily.  Marland Kitchen terbinafine (LAMISIL) 1 % cream Apply topically daily as needed (toe).  . traZODone (DESYREL) 50 MG tablet Take 0.5 tablets (25 mg total) by mouth at bedtime as needed for sleep.   No facility-administered encounter medications on file as of 02/16/2020.     SIGNIFICANT DIAGNOSTIC EXAMS   TODAY  02-06-20: chest x-ray:  1. No active cardiopulmonary disease. 2. Probable nipple shadows at the bilateral lung bases. Repeat radiograph with nipple markers could be performed to confirm.  02-10-20: acute abdomen x-ray: Diffuse stool throughout colon. No bowel obstruction or free air. Lungs clear.  02-10-20: abdominal ultrasound:  1. Gallbladder is difficult to delineate, however may be contracted and filled with stones creating a wall echo shadow complex. No sonographic Murphy sign. If there is clinical concern for acute cholecystitis, consider further evaluation with nuclear medicine HIDA scan. 2. No biliary dilatation. 3. Heterogeneous hepatic parenchyma. Small hyperechoic lesion is likely hemangioma. The larger lesion on prior ultrasound is not well-defined on the current exam and currently blends with adjacent heterogeneous parenchyma. This could be further evaluated with hepatic protocol MRI on an elective basis. 4. Trace abdominal ascites.   02-12-20: MRI abdomen with mrcp 1. Innumerable malignant appearing lesions in the hepatic parenchyma, most notable for a dominant mass in the right lobe of the liver which currently measures 13.9 x 8.8 x 9.9 cm. The imaging characteristics of these lesions are nonspecific, but appear grass of (presumably malignant). Correlation with biopsy is recommended to establish a tissue diagnosis if clinically appropriate. 2. Cholelithiasis without evidence of acute  cholecystitis at this time. No choledocholithiasis or signs of biliary tract obstruction. 3. Colonic diverticulosis. 4. Small right pleural effusion lying dependently. 5. Additional incidental findings, as above.   LABS REVIEWED TODAY  02-06-20: wbc 7.8; hgb 11.1; hct 33.9; mcv 101.5 plt 120; glucose 139; bun 43; creat 2.53; k+ 4.6; an++ 136; ca 9.5 ;ast 79; alk phos 448; albumin 3.7 tsh 2.209 hgb a1c 5.5 urine culture: <10,000  02-08-20: glucose 115; bun 32; creat 1.99; k+ 4.4; na++ 137; ca 9.2 mag 1.8 02-10-20: wbc 11.4; hgb 10.6; hct 32.8; mcv 101.9 plt 136; glucose 160; bun 35; creat 1.87; k+ 4.9; ca 137; ca 9.1; ast 89; alk pohs 376; total bili 1.3 albumin 2.9 02-12-20: wbc 10.1; hgb 10.6; hct 32.6; mcv 99.4 plt 162; glucose  114; bun 33; creat 1.68; k+ 3.7; na++ 135; ca 8.8 ast 56; alk phos 322; albumin 2.6 mag 1.8 02-13-20: glucose 118; bun 38; creat 1.98; k+ 4.3; na++ 136; ca 8.2 ast 95 alk phos 381; albumin 2.5     Review of Systems  Constitutional: Negative for malaise/fatigue.  Respiratory: Negative for cough and shortness of breath.   Cardiovascular: Negative for chest pain and leg swelling.  Gastrointestinal: Positive for abdominal pain. Negative for constipation and heartburn.  Musculoskeletal: Negative for back pain, joint pain and myalgias.  Skin: Negative.   Neurological: Negative for dizziness.  Psychiatric/Behavioral: The patient is not nervous/anxious.     Physical Exam Constitutional:      General: He is not in acute distress.    Appearance: He is well-developed. He is not diaphoretic.     Comments: thin  Neck:     Thyroid: No thyromegaly.  Cardiovascular:     Rate and Rhythm: Normal rate and regular rhythm.     Heart sounds: Normal heart sounds.  Pulmonary:     Effort: Pulmonary effort is normal. No respiratory distress.     Breath sounds: Normal breath sounds.  Abdominal:     General: Bowel sounds are normal. There is no distension.     Palpations: Abdomen is  soft.     Tenderness: There is abdominal tenderness.     Comments: Generalized tenderness present   Musculoskeletal:        General: Normal range of motion.     Cervical back: Neck supple.     Right lower leg: No edema.     Left lower leg: No edema.  Lymphadenopathy:     Cervical: No cervical adenopathy.  Skin:    General: Skin is warm and dry.  Neurological:     Mental Status: He is alert. Mental status is at baseline.  Psychiatric:        Mood and Affect: Mood normal.       ASSESSMENT/ PLAN:  TODAY  1. Calculus of gallbladder without cholecystitis without obstruction/liver mass right lobe/right upper quad pain: is without change in status; biopsy results pending. Will change to oxycodone 5 mg every 6 hours on a routine basis.  2. Essential hypertension: is stable 101/59 will continue norvasc 2.5 mg daily asa 81 mg daily   3. Senile dementia without behavioral disturbance: without change in status; weight is 169 pounds will monitor   4. Obstructive sleep apnea syndrome: does not use CPAP   5. Acute renal failure superimposed on stage 3b chronic kidney disease unspecified renal failure type: is stable bun 38; creat 1.98  6. Anemia in stage 3 b chronic kidney disease: is stable hgb 10.6 will continue fergon daily   7. Thrombocytopenia: is stable plt 162 will monitor her status.   8. Glaucoma of both eyes unspecified glaucoma type: is stable will continue xalatan to both eyes daily   9. Chronic constipation: is stable will continue senna s twice daily   10. Protein calorie malnutrition severe is without change albumin 2.5 will begin prostat twice daily   Will check cbc; cmp     MD is aware of resident's narcotic use and is in agreement with current plan of care. We will attempt to wean resident as appropriate.  Ok Edwards NP [facility]  Contact [phone] Monday through Friday 8am- 5pm  After hours call [phone]

## 2020-02-17 ENCOUNTER — Telehealth: Payer: Self-pay | Admitting: Nurse Practitioner

## 2020-02-17 ENCOUNTER — Non-Acute Institutional Stay (SKILLED_NURSING_FACILITY): Payer: Medicare Other | Admitting: Internal Medicine

## 2020-02-17 ENCOUNTER — Ambulatory Visit (HOSPITAL_COMMUNITY)
Admission: RE | Admit: 2020-02-17 | Discharge: 2020-02-17 | Disposition: A | Discharge status: Home / Self Care | Payer: Medicare Other | Source: Ambulatory Visit | Attending: Internal Medicine | Admitting: Internal Medicine

## 2020-02-17 ENCOUNTER — Encounter: Payer: Self-pay | Admitting: Internal Medicine

## 2020-02-17 ENCOUNTER — Encounter (HOSPITAL_COMMUNITY)
Admission: RE | Admit: 2020-02-17 | Discharge: 2020-02-17 | Disposition: A | Discharge status: Home / Self Care | Payer: Medicare Other | Source: Ambulatory Visit | Attending: *Deleted | Admitting: *Deleted

## 2020-02-17 ENCOUNTER — Other Ambulatory Visit: Payer: Self-pay | Admitting: Adult Health

## 2020-02-17 DIAGNOSIS — M7989 Other specified soft tissue disorders: Secondary | ICD-10-CM | POA: Insufficient documentation

## 2020-02-17 DIAGNOSIS — E119 Type 2 diabetes mellitus without complications: Secondary | ICD-10-CM

## 2020-02-17 DIAGNOSIS — N179 Acute kidney failure, unspecified: Secondary | ICD-10-CM | POA: Diagnosis not present

## 2020-02-17 DIAGNOSIS — C229 Malignant neoplasm of liver, not specified as primary or secondary: Secondary | ICD-10-CM

## 2020-02-17 DIAGNOSIS — R600 Localized edema: Secondary | ICD-10-CM

## 2020-02-17 DIAGNOSIS — N1832 Chronic kidney disease, stage 3b: Secondary | ICD-10-CM

## 2020-02-17 DIAGNOSIS — R52 Pain, unspecified: Secondary | ICD-10-CM | POA: Insufficient documentation

## 2020-02-17 LAB — D-DIMER, QUANTITATIVE: D-Dimer, Quant: >20 ug/mL-FEU — ABNORMAL HIGH (ref 0.00–0.50)

## 2020-02-17 LAB — SURGICAL PATHOLOGY

## 2020-02-17 MED ORDER — OXYCODONE HCL 5 MG PO TABS: 5.0000 mg | ORAL_TABLET | Freq: Four times a day (QID) | ORAL | 0 refills | Status: DC

## 2020-02-17 NOTE — Assessment & Plan Note (Addendum)
I discussed path findings with son & daughter in law. Most appropriate course may be Hospice at home.

## 2020-02-17 NOTE — Assessment & Plan Note (Addendum)
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

## 2020-02-17 NOTE — Patient Instructions (Addendum)
See assessment and plan under each diagnosis in the problem list and acutely for this visit. I discussed the hepatic biopsy results with his son and daughter-in-law.We also discussed the asymmetric lower extremity edema which could represent DVT in the context of malignant disease. I recommended that they discuss with treating physicians as to the risk/benefit of any interventions for their loved one.This would entail treating processes such as DVT; pain control; and antibiotics for infections w/o aggressive interventions potentially associated with health or life threatening complications. The emphasis should be on providing him with comfort and dignity. An incredibly sensitive and compassionate discussion of these issues is found in the book Being Mortal by Dr Eda Keys. I recommended  this to them as a valuable guide during these difficult times.

## 2020-02-17 NOTE — Assessment & Plan Note (Signed)
02/13/2020 creatinine 1.98, GFR 34 indicating stage CKD 3B Avoid nephrotoxic meds.

## 2020-02-17 NOTE — Progress Notes (Signed)
NURSING HOME LOCATION:  [place] NUMBER: 131/P   CODE STATUS: Full Code PCP:  Rosita Fire, MD  This is a comprehensive admission note to [facility] performed on this date less than 30 days from date of admission. Included are preadmission medical/surgical history; reconciled medication list; family history; social history and comprehensive review of systems.  Corrections and additions to the records were documented. Comprehensive physical exam was also performed. Additionally a clinical summary was entered for each active diagnosis pertinent to this admission in the Problem List to enhance continuity of care.  HPI: He was hospitalized 7/23-7/30/2021 admitted from home with 1 week history of weakness and malaise.  The day prior to admission he had stood and experienced syncope, falling on his right arm. He was unable to stand by himself and his wife required help to get him off the floor.Imaging revealed no right upper extremity fracture. Initial clinical impression was possible UTI but cultures revealed insignificant growth. He did exhibit AKI on CKD which returned to baseline with IV fluid hydration. He was complaining of persistent abdominal pain in the context of cholelithiasis. Additional imaging revealed liver masses for which biopsy was performed on 7/30.  Final pathology was not available at discharge but has returned.Biopsy reveals adenocarcinoma with clear cells. The results were supposed to be reviewed with Dr. Delton Coombes 1 week following discharge. He was discharged to SNF for rehab but long-term prognosis appeared clinically poor even prior to the final path results being received. In addition to falling he had been exhibiting anorexia. Oral diabetic agents were held because of his poor intake. Hospice evaluation had been considered; but the family wanted to discuss the pathologic findings with the Oncologist prior to making that decision.   Past medical  and surgical history: Includes essential hypertension, sleep apnea, history of H. pylori infection, glaucoma, diverticulosis, diabetes with stage IIIb CKD, and dementia. No surgeries recorded in his medical record; presumably with a history of diverticulosis he may have had a colonoscopy; however,his son was unsure  Social history: Nondrinker, never smoked. Family history: Limited history reviewed.  Family history is noncontributory due to his advanced age.   Review of systems:  Could not be completed due to dementia. He was essentially nonverbal & could not follow commands.His son & daughter in Sports coach ,both former Designer, industrial/product, were present.  I discussed the pathology results with him.  We also did discuss the findings of asymmetric lower extremity edema and the possible implications of this.  Physical exam:  Pertinent or positive findings: He appears somewhat chronically ill and suboptimally nourished.  He sat in the wheelchair asleep and slumped to the left for most of the interview.  He would not open his eyes or mouth for evaluation.  He could not follow commands.  He has a Museum/gallery conservator and is unshaven.  He exhibits a tachycardia.  Breath sounds are decreased anteriorly.  He seems to be tender to palpation in the right upper quadrant; but I could not palpate hepatomegaly or masses.  The liver was not enlarged to percussion.  He has one half-1+ right lower extremity edema which is visible despite his wearing TED hose.  There is no edema noted on the left.  Bevelyn Buckles' sign appears clinically negative.  General appearance:no acute distress, increased work of breathing is present.   Lymphatic: No lymphadenopathy about the head, neck, axilla. Ears:  External ear exam shows no significant lesions or deformities.   Nose:  External nasal examination shows  no deformity or inflammation. Nasal mucosa are pink and moist without lesions, exudates Neck:  No thyromegaly, masses, tenderness noted.    Heart:  No murmur,  click, rub.  Lungs: without wheezes, rhonchi, rales, rubs. Abdomen: Bowel sounds are normal.  Abdomen is soft with no organomegaly, hernias, masses. GU: Deferred  Extremities:  No cyanosis, clubbing. Neurologic exam: Balance, Rhomberg, finger to nose testing could not be completed due to clinical state Skin:No significant lesions or rash.  See clinical summary under each active problem in the Problem List with associated updated therapeutic plan

## 2020-02-17 NOTE — Telephone Encounter (Signed)
Received call from radiology: positive DVT. Spoke with Bard Herbert: starting Lovenox 1mg /Kg [place] bid, Jackelyn Poling will see the patient in morning.

## 2020-02-17 NOTE — Assessment & Plan Note (Addendum)
Off oral agents due to anorexia and poor oral intake. No significant hyperglycemia to date; glucose range in hospital 101-160

## 2020-02-18 ENCOUNTER — Encounter (HOSPITAL_COMMUNITY): Payer: Self-pay

## 2020-02-19 ENCOUNTER — Encounter: Payer: Self-pay | Admitting: Adult Health

## 2020-02-19 ENCOUNTER — Ambulatory Visit (HOSPITAL_COMMUNITY)
Admission: RE | Admit: 2020-02-19 | Discharge: 2020-02-19 | Disposition: A | Discharge status: Home / Self Care | Payer: Medicare Other | Source: Ambulatory Visit | Attending: Adult Health | Admitting: Adult Health

## 2020-02-19 ENCOUNTER — Other Ambulatory Visit (HOSPITAL_COMMUNITY)
Admission: RE | Admit: 2020-02-19 | Discharge: 2020-02-19 | Disposition: A | Discharge status: Home / Self Care | Payer: Medicare Other | Source: Ambulatory Visit | Attending: Adult Health | Admitting: Adult Health

## 2020-02-19 ENCOUNTER — Telehealth: Payer: Self-pay | Admitting: Family

## 2020-02-19 ENCOUNTER — Non-Acute Institutional Stay (SKILLED_NURSING_FACILITY): Payer: Medicare Other | Admitting: Adult Health

## 2020-02-19 DIAGNOSIS — C229 Malignant neoplasm of liver, not specified as primary or secondary: Secondary | ICD-10-CM

## 2020-02-19 DIAGNOSIS — D696 Thrombocytopenia, unspecified: Secondary | ICD-10-CM | POA: Insufficient documentation

## 2020-02-19 DIAGNOSIS — N1330 Unspecified hydronephrosis: Secondary | ICD-10-CM | POA: Diagnosis not present

## 2020-02-19 DIAGNOSIS — I82411 Acute embolism and thrombosis of right femoral vein: Secondary | ICD-10-CM

## 2020-02-19 DIAGNOSIS — F0390 Unspecified dementia without behavioral disturbance: Secondary | ICD-10-CM | POA: Diagnosis not present

## 2020-02-19 DIAGNOSIS — D631 Anemia in chronic kidney disease: Secondary | ICD-10-CM | POA: Insufficient documentation

## 2020-02-19 LAB — CBC WITH DIFFERENTIAL/PLATELET
Abs Immature Granulocytes: 0.2 10*3/uL — ABNORMAL HIGH (ref 0.00–0.07)
Basophils Absolute: 0 10*3/uL (ref 0.0–0.1)
Basophils Relative: 0 %
Eosinophils Absolute: 0 10*3/uL (ref 0.0–0.5)
Eosinophils Relative: 0 %
HCT: 32.5 % — ABNORMAL LOW (ref 39.0–52.0)
Hemoglobin: 10.4 g/dL — ABNORMAL LOW (ref 13.0–17.0)
Immature Granulocytes: 1 %
Lymphocytes Relative: 5 %
Lymphs Abs: 1.1 10*3/uL (ref 0.7–4.0)
MCH: 31.9 pg (ref 26.0–34.0)
MCHC: 32 g/dL (ref 30.0–36.0)
MCV: 99.7 fL (ref 80.0–100.0)
Monocytes Absolute: 1.3 10*3/uL — ABNORMAL HIGH (ref 0.1–1.0)
Monocytes Relative: 6 %
Neutro Abs: 17.9 10*3/uL — ABNORMAL HIGH (ref 1.7–7.7)
Neutrophils Relative %: 88 %
Platelets: 194 10*3/uL (ref 150–400)
RBC: 3.26 MIL/uL — ABNORMAL LOW (ref 4.22–5.81)
RDW: 14.3 % (ref 11.5–15.5)
WBC: 20.4 10*3/uL — ABNORMAL HIGH (ref 4.0–10.5)
nRBC: 0 % (ref 0.0–0.2)

## 2020-02-19 LAB — COMPREHENSIVE METABOLIC PANEL
ALT: 59 U/L — ABNORMAL HIGH (ref 0–44)
AST: 112 U/L — ABNORMAL HIGH (ref 15–41)
Albumin: 2.5 g/dL — ABNORMAL LOW (ref 3.5–5.0)
Alkaline Phosphatase: 597 U/L — ABNORMAL HIGH (ref 38–126)
Anion gap: 15 (ref 5–15)
BUN: 79 mg/dL — ABNORMAL HIGH (ref 8–23)
CO2: 23 mmol/L (ref 22–32)
Calcium: 9 mg/dL (ref 8.9–10.3)
Chloride: 96 mmol/L — ABNORMAL LOW (ref 98–111)
Creatinine, Ser: 3.01 mg/dL — ABNORMAL HIGH (ref 0.61–1.24)
GFR calc Af Amer: 20 mL/min — ABNORMAL LOW (ref >60)
GFR calc non Af Amer: 18 mL/min — ABNORMAL LOW (ref >60)
Glucose, Bld: 230 mg/dL — ABNORMAL HIGH (ref 70–99)
Potassium: 5.4 mmol/L — ABNORMAL HIGH (ref 3.5–5.1)
Sodium: 134 mmol/L — ABNORMAL LOW (ref 135–145)
Total Bilirubin: 2.3 mg/dL — ABNORMAL HIGH (ref 0.3–1.2)
Total Protein: 7.3 g/dL (ref 6.5–8.1)

## 2020-02-19 NOTE — Telephone Encounter (Signed)
Facility Nurse called state patient chest X-ray indicated bilateral atelectasis.Patient has been febrile since Monday on and off.Labs pending and urine analysis.Order given for Doxycycline 100 mg tablet twice daily x 7 days along with Probiotics.Please follow up in the morning might need addition days of antibiotics.

## 2020-02-19 NOTE — Progress Notes (Signed)
Location:    [place] Room Number: 131/P Place of Service:  SNF (31)   CODE STATUS: Full Code  No Known Allergies  Chief Complaint  Patient presents with   Acute Visit    DVT    HPI:  He had a right lower extremity u/s done demonstrated extensive right lower extremity dvt. His chest x-ray shows right pleural effusion with bilateral atelectasis. His d-dimer is >20; he has been started on lovenox therapy daily due to his renal function. He does not appear to be in pain. His renal function is worse; his renal ultrasound does not demonstrate hydronephrosis. His wbc is 20.4. his urine and blood cultures are pending. I have spoken with his wife regarding his poor prognosis. We will need a family care plan meeting to discuss his code status and advanced directives; he is more appropriate for hospice at this time.   Past Medical History:  Diagnosis Date   Allergic rhinitis, cause unspecified    Dementia ([place])    Diabetes ([place])    Diverticulosis    DJD (degenerative joint disease)    Glaucoma    History of Helicobacter pylori infection    Sleep apnea    NO CPAP MACHINE    Unspecified essential hypertension     No past surgical history on file.  Social History   Socioeconomic History   Marital status: Married    Spouse name: Not on file   Number of children: 3   Years of education: Not on file   Highest education level: Not on file  Occupational History   Occupation: Retired   Tobacco Use   Smoking status: Never Smoker   Smokeless tobacco: Never Used  Scientific laboratory technician Use: Never used  Substance and Sexual Activity   Alcohol use: No   Drug use: No   Sexual activity: Not on file  Other Topics Concern   Not on file  Social History Narrative   Coffee daily    Social Determinants of Health   Financial Resource Strain:    Difficulty of Paying Living Expenses:   Food Insecurity:    Worried About Charity fundraiser in  the Last Year:    Arboriculturist in the Last Year:   Transportation Needs:    Film/video editor (Medical):    Lack of Transportation (Non-Medical):   Physical Activity:    Days of Exercise per Week:    Minutes of Exercise per Session:   Stress:    Feeling of Stress :   Social Connections:    Frequency of Communication with Friends and Family:    Frequency of Social Gatherings with Friends and Family:    Attends Religious Services:    Active Member of Clubs or Organizations:    Attends Music therapist:    Marital Status:   Intimate Partner Violence:    Fear of Current or Ex-Partner:    Emotionally Abused:    Physically Abused:    Sexually Abused:    Family History  Problem Relation Age of Onset   Colon cancer Neg Hx       VITAL SIGNS BP (!) 150/77    Pulse 92    Temp 97.7 F (36.5 C) (Oral)    Resp 20    Ht '5\' 11"'  (1.803 m)    Wt 171 lb (77.6 kg)    SpO2 95%    BMI 23.85 kg/m   Outpatient Encounter Medications as  of 02/19/2020  Medication Sig   Amino Acids-Protein Hydrolys (FEEDING SUPPLEMENT, PRO-STAT 64,) LIQD Take 30 mLs by mouth 2 (two) times daily.   amLODipine (NORVASC) 2.5 MG tablet Take 2.5 mg by mouth daily.   aspirin 81 MG tablet Take 81 mg by mouth daily.   Cholecalciferol (VITAMIN D3) 25 MCG (1000 UT) CAPS Take 1 capsule by mouth in the morning.   enoxaparin (LOVENOX) 80 MG/0.8ML injection Inject 80 mg into the skin daily.   ferrous gluconate (FERGON) 324 MG tablet Take 324 mg by mouth daily with breakfast.   latanoprost (XALATAN) 0.005 % ophthalmic solution Place 1 drop into both eyes every morning.   Multiple Vitamin (MULTIVITAMIN WITH MINERALS) TABS tablet Take 1 tablet by mouth daily.   NON FORMULARY Regular diet   ondansetron (ZOFRAN) 4 MG tablet Take 1 tablet (4 mg total) by mouth every 6 (six) hours as needed for nausea.   oxyCODONE (OXY IR/ROXICODONE) 5 MG immediate release tablet Take 1 tablet (5 mg  total) by mouth every 6 (six) hours. For moderate pain and severe pain or breakthrough pain   senna-docusate (SENOKOT-S) 8.6-50 MG tablet Take 1 tablet by mouth 2 (two) times daily.   terbinafine (LAMISIL) 1 % cream Apply topically daily as needed (toe).   traZODone (DESYREL) 50 MG tablet Take 0.5 tablets (25 mg total) by mouth at bedtime as needed for sleep.   No facility-administered encounter medications on file as of 02/19/2020.     SIGNIFICANT DIAGNOSTIC EXAMS  PREVIOUS  02-06-20: chest x-ray:  1. No active cardiopulmonary disease. 2. Probable nipple shadows at the bilateral lung bases. Repeat radiograph with nipple markers could be performed to confirm.  02-10-20: acute abdomen x-ray: Diffuse stool throughout colon. No bowel obstruction or free air. Lungs clear.  02-10-20: abdominal ultrasound:  1. Gallbladder is difficult to delineate, however may be contracted and filled with stones creating a wall echo shadow complex. No sonographic Murphy sign. If there is clinical concern for acute cholecystitis, consider further evaluation with nuclear medicine HIDA scan. 2. No biliary dilatation. 3. Heterogeneous hepatic parenchyma. Small hyperechoic lesion is likely hemangioma. The larger lesion on prior ultrasound is not well-defined on the current exam and currently blends with adjacent heterogeneous parenchyma. This could be further evaluated with hepatic protocol MRI on an elective basis. 4. Trace abdominal ascites.   02-12-20: MRI abdomen with mrcp 1. Innumerable malignant appearing lesions in the hepatic parenchyma, most notable for a dominant mass in the right lobe of the liver which currently measures 13.9 x 8.8 x 9.9 cm. The imaging characteristics of these lesions are nonspecific, but appear grass of (presumably malignant). Correlation with biopsy is recommended to establish a tissue diagnosis if clinically appropriate. 2. Cholelithiasis without evidence of acute cholecystitis at  this time. No choledocholithiasis or signs of biliary tract obstruction. 3. Colonic diverticulosis. 4. Small right pleural effusion lying dependently. 5. Additional incidental findings, as above.  TODAY  02-17-20: right lower extremity venous doppler: Extensive deep venous thrombus in right lower extremity beginning at the common femoral vein bifurcation and extending into the trifurcation in the calf.  02-19-20: renal ultrasound:  1. Limited exam.  No evidence of obstructive uropathy. 2. Heterogeneous appearance of the liver compatible with known infiltrative process seen on prior imaging. 3. Prostate gland appears enlarged and heterogeneous. Correlate with serum PSA.  02-19-20: chest x-ray: Bilateral basilar atelectasis   Right effusion.    LABS REVIEWED PREVIOUS  02-06-20: wbc 7.8; hgb 11.1; hct 33.9; mcv 101.5  plt 120; glucose 139; bun 43; creat 2.53; k+ 4.6; an++ 136; ca 9.5 ;ast 79; alk phos 448; albumin 3.7 tsh 2.209 hgb a1c 5.5 urine culture: <10,000  02-08-20: glucose 115; bun 32; creat 1.99; k+ 4.4; na++ 137; ca 9.2 mag 1.8 02-10-20: wbc 11.4; hgb 10.6; hct 32.8; mcv 101.9 plt 136; glucose 160; bun 35; creat 1.87; k+ 4.9; ca 137; ca 9.1; ast 89; alk pohs 376; total bili 1.3 albumin 2.9 02-12-20: wbc 10.1; hgb 10.6; hct 32.6; mcv 99.4 plt 162; glucose 114; bun 33; creat 1.68; k+ 3.7; na++ 135; ca 8.8 ast 56; alk phos 322; albumin 2.6 mag 1.8 02-13-20: glucose 118; bun 38; creat 1.98; k+ 4.3; na++ 136; ca 8.2 ast 95 alk phos 381; albumin 2.5 pathology report:  Adenocarcinoma with clear cells  TODAY  02-17-20: d-dimer: >20.00 02-19-20: wbc 20.4; hgb 10.4; hct 32.5; mcv 99.7 plt 194; glucose 230; bun 79; creat 3.01; k+ 5.4; na++ 134; ca 9.0; ast 112 alt 59 alk phos 597; total bili 2.3 albumin 2.5   Review of Systems  Unable to perform ROS: Dementia (unable to participate )    Physical Exam Constitutional:      General: He is not in acute distress.    Appearance: He is well-developed. He  is not diaphoretic.     Comments: thin  Neck:     Thyroid: No thyromegaly.  Cardiovascular:     Rate and Rhythm: Normal rate and regular rhythm.     Heart sounds: Normal heart sounds.  Pulmonary:     Effort: Pulmonary effort is normal. No respiratory distress.     Breath sounds: Normal breath sounds.     Comments: Does not take deep breaths on command Abdominal:     General: Bowel sounds are normal. There is no distension.     Palpations: Abdomen is soft.     Tenderness: There is abdominal tenderness.     Comments: Generalized   Musculoskeletal:        General: Normal range of motion.     Cervical back: Neck supple.     Right lower leg: No edema.     Left lower leg: No edema.  Lymphadenopathy:     Cervical: No cervical adenopathy.  Skin:    General: Skin is warm and dry.  Neurological:     Mental Status: He is alert. Mental status is at baseline.  Psychiatric:        Mood and Affect: Mood normal.        ASSESSMENT/ PLAN:  TODAY  1. Acute deep vein thrombosis (DVT) of right femoral vein 2. Senile dementia without behavioral disturbance 3. Liver carcinoma  Blood cultures pending He is not appropriate for IVF due to his effusion His renal function is declining Will setup care plan meeting for the AM.   MD is aware of resident's narcotic use and is in agreement with current plan of care. We will attempt to wean resident as appropriate.  Ok Edwards NP [facility]  Contact [phone] Monday through Friday 8am- 5pm  After hours call [phone]

## 2020-02-20 ENCOUNTER — Ambulatory Visit: Payer: Medicare Other | Admitting: Podiatry

## 2020-02-20 ENCOUNTER — Non-Acute Institutional Stay (SKILLED_NURSING_FACILITY): Payer: Medicare Other | Admitting: Adult Health

## 2020-02-20 ENCOUNTER — Encounter: Payer: Self-pay | Admitting: Adult Health

## 2020-02-20 ENCOUNTER — Other Ambulatory Visit: Payer: Self-pay | Admitting: Adult Health

## 2020-02-20 DIAGNOSIS — F0390 Unspecified dementia without behavioral disturbance: Secondary | ICD-10-CM

## 2020-02-20 DIAGNOSIS — I82411 Acute embolism and thrombosis of right femoral vein: Secondary | ICD-10-CM

## 2020-02-20 DIAGNOSIS — C229 Malignant neoplasm of liver, not specified as primary or secondary: Secondary | ICD-10-CM

## 2020-02-20 LAB — URINE CULTURE: Culture: NO GROWTH

## 2020-02-20 MED ORDER — OXYCODONE HCL 5 MG PO TABS: 5.0000 mg | ORAL_TABLET | ORAL | 0 refills | Status: DC

## 2020-02-20 NOTE — Progress Notes (Signed)
Location:    [place] Room Number: 131/P Place of Service:  SNF (31)   CODE STATUS: Full Code  No Known Allergies  Chief Complaint  Patient presents with  . Acute Visit    Care Plan Meeting    HPI:  We have come together for his care plan meeting. Family is present. We have spent an extensive period of time discussing his overall status his very poor prognosis. We have discussed his advanced directives to include his code status discussed CPR and expected outcomes; we have discussed hospitalizations and expected outcomes; we have discussed tube feeding options. We have filled out a MOST form. He is total care with his adls including feeding. He is incontinent of bladder and bowel. He is having pain management issues the oxycodone is not holding him for 6 hours. We have discussed his plan of care with quality versus quantity of life. They have decided to focus his care upon comfort. They are looking to take him home with hospice care early next week.his renal function is worse;he has a right pleural effusion his liver function is worse.   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 Allergic rhinitis, cause unspecified   . Dementia ([place])   . Diabetes ([place])   . Diverticulosis   . DJD (degenerative joint disease)   . Glaucoma   . History of Helicobacter pylori infection   . Sleep apnea    NO CPAP MACHINE   . Unspecified essential hypertension     No past surgical history on file.  Social History   Socioeconomic History  . Marital status: Married    Spouse name: Not on file  . Number of children: 3  . Years of education: Not on file  . Highest education level: Not on file  Occupational History  . Occupation: Retired   Tobacco Use  . Smoking status: Never  Smoker  . Smokeless tobacco: Never Used  Vaping Use  . Vaping Use: Never used  Substance and Sexual Activity  . Alcohol use: No  . Drug use: No  . Sexual activity: Not on file  Other Topics Concern  . Not on file  Social History Narrative   Coffee daily    Social Determinants of Health   Financial Resource Strain:   . Difficulty of Paying Living Expenses:   Food Insecurity:   . Worried About Charity fundraiser in the Last Year:   . Arboriculturist in the Last Year:   Transportation Needs:   . Film/video editor (Medical):   Marland Kitchen Lack of Transportation (Non-Medical):   Physical Activity:   . Days of Exercise per Week:   . Minutes of Exercise per Session:   Stress:   . Feeling of Stress :   Social Connections:   . Frequency of Communication with Friends and Family:   . Frequency of Social Gatherings with Friends and Family:   . Attends Religious Services:   . Active Member of Clubs or Organizations:   . Attends Archivist Meetings:   Marland Kitchen Marital Status:   Intimate Partner Violence:   . Fear of Current or Ex-Partner:   . Emotionally Abused:   Marland Kitchen Physically Abused:   . Sexually Abused:    Family History  Problem Relation Age of  Onset  . Colon cancer Neg Hx       VITAL SIGNS BP 135/83   Pulse 89   Temp (!) 97.2 F (36.2 C) (Oral)   Resp 20   Ht '5\' 11"'  (1.803 m)   Wt 171 lb (77.6 kg)   SpO2 98%   BMI 23.85 kg/m   Outpatient Encounter Medications as of 02/20/2020  Medication Sig  . acetaminophen (TYLENOL) 325 MG tablet Take 650 mg by mouth every 6 (six) hours as needed.  . Amino Acids-Protein Hydrolys (FEEDING SUPPLEMENT, PRO-STAT 64,) LIQD Take 30 mLs by mouth 2 (two) times daily.  Marland Kitchen amLODipine (NORVASC) 2.5 MG tablet Take 2.5 mg by mouth daily.  Marland Kitchen aspirin 81 MG tablet Take 81 mg by mouth daily.  . Cholecalciferol (VITAMIN D3) 25 MCG (1000 UT) CAPS Take 1 capsule by mouth in the morning.  Marland Kitchen doxycycline (VIBRAMYCIN) 100 MG capsule Take 100 mg by  mouth 2 (two) times daily. Special Instructions: dx BIL basilar atelectasis, right effusion Give 7 Days Hold 1 Day  . enoxaparin (LOVENOX) 80 MG/0.8ML injection Inject 80 mg into the skin daily.  . ferrous gluconate (FERGON) 324 MG tablet Take 324 mg by mouth daily with breakfast.  . latanoprost (XALATAN) 0.005 % ophthalmic solution Place 1 drop into both eyes every morning.  . Multiple Vitamin (MULTIVITAMIN WITH MINERALS) TABS tablet Take 1 tablet by mouth daily.  . NON FORMULARY Regular diet  . ondansetron (ZOFRAN) 4 MG tablet Take 1 tablet (4 mg total) by mouth every 6 (six) hours as needed for nausea.  Marland Kitchen oxyCODONE (OXY IR/ROXICODONE) 5 MG immediate release tablet Take 1 tablet (5 mg total) by mouth every 6 (six) hours. For moderate pain and severe pain or breakthrough pain  . Probiotic Product (RISA-BID PROBIOTIC) TABS Take 1 tablet by mouth 2 (two) times daily.  Marland Kitchen senna-docusate (SENOKOT-S) 8.6-50 MG tablet Take 1 tablet by mouth 2 (two) times daily.  Marland Kitchen terbinafine (LAMISIL) 1 % cream Apply topically daily as needed (toe).  . traZODone (DESYREL) 50 MG tablet Take 0.5 tablets (25 mg total) by mouth at bedtime as needed for sleep.   No facility-administered encounter medications on file as of 02/20/2020.     SIGNIFICANT DIAGNOSTIC EXAMS   PREVIOUS  02-06-20: chest x-ray:  1. No active cardiopulmonary disease. 2. Probable nipple shadows at the bilateral lung bases. Repeat radiograph with nipple markers could be performed to confirm.  02-10-20: acute abdomen x-ray: Diffuse stool throughout colon. No bowel obstruction or free air. Lungs clear.  02-10-20: abdominal ultrasound:  1. Gallbladder is difficult to delineate, however may be contracted and filled with stones creating a wall echo shadow complex. No sonographic Murphy sign. If there is clinical concern for acute cholecystitis, consider further evaluation with nuclear medicine HIDA scan. 2. No biliary dilatation. 3. Heterogeneous  hepatic parenchyma. Small hyperechoic lesion is likely hemangioma. The larger lesion on prior ultrasound is not well-defined on the current exam and currently blends with adjacent heterogeneous parenchyma. This could be further evaluated with hepatic protocol MRI on an elective basis. 4. Trace abdominal ascites.   02-12-20: MRI abdomen with mrcp 1. Innumerable malignant appearing lesions in the hepatic parenchyma, most notable for a dominant mass in the right lobe of the liver which currently measures 13.9 x 8.8 x 9.9 cm. The imaging characteristics of these lesions are nonspecific, but appear grass of (presumably malignant). Correlation with biopsy is recommended to establish a tissue diagnosis if clinically appropriate. 2. Cholelithiasis without evidence of  acute cholecystitis at this time. No choledocholithiasis or signs of biliary tract obstruction. 3. Colonic diverticulosis. 4. Small right pleural effusion lying dependently. 5. Additional incidental findings, as above.  02-17-20: right lower extremity venous doppler: Extensive deep venous thrombus in right lower extremity beginning at the common femoral vein bifurcation and extending into the trifurcation in the calf.  02-19-20: renal ultrasound:  1. Limited exam.  No evidence of obstructive uropathy. 2. Heterogeneous appearance of the liver compatible with known infiltrative process seen on prior imaging. 3. Prostate gland appears enlarged and heterogeneous. Correlate with serum PSA.  02-19-20: chest x-ray: Bilateral basilar atelectasis   Right effusion.  NO NEW EXAMS.     LABS REVIEWED PREVIOUS  02-06-20: wbc 7.8; hgb 11.1; hct 33.9; mcv 101.5 plt 120; glucose 139; bun 43; creat 2.53; k+ 4.6; an++ 136; ca 9.5 ;ast 79; alk phos 448; albumin 3.7 tsh 2.209 hgb a1c 5.5 urine culture: <10,000  02-08-20: glucose 115; bun 32; creat 1.99; k+ 4.4; na++ 137; ca 9.2 mag 1.8 02-10-20: wbc 11.4; hgb 10.6; hct 32.8; mcv 101.9 plt 136; glucose 160; bun 35;  creat 1.87; k+ 4.9; ca 137; ca 9.1; ast 89; alk pohs 376; total bili 1.3 albumin 2.9 02-12-20: wbc 10.1; hgb 10.6; hct 32.6; mcv 99.4 plt 162; glucose 114; bun 33; creat 1.68; k+ 3.7; na++ 135; ca 8.8 ast 56; alk phos 322; albumin 2.6 mag 1.8 02-13-20: glucose 118; bun 38; creat 1.98; k+ 4.3; na++ 136; ca 8.2 ast 95 alk phos 381; albumin 2.5 pathology report:  Adenocarcinoma with clear cells 02-17-20: d-dimer: >20.00 02-19-20: wbc 20.4; hgb 10.4; hct 32.5; mcv 99.7 plt 194; glucose 230; bun 79; creat 3.01; k+ 5.4; na++ 134; ca 9.0; ast 112 alt 59 alk phos 597; total bili 2.3 albumin 2.5   NO NEW LABS.   Review of Systems  Unable to perform ROS: Dementia (unable to participate )    Physical Exam Constitutional:      General: He is not in acute distress.    Appearance: He is well-developed. He is not diaphoretic.  Neck:     Thyroid: No thyromegaly.  Cardiovascular:     Rate and Rhythm: Normal rate and regular rhythm.     Pulses: Normal pulses.     Heart sounds: Normal heart sounds.  Pulmonary:     Effort: Pulmonary effort is normal. No respiratory distress.     Breath sounds: Normal breath sounds.     Comments: Does not take deep breaths on command Abdominal:     General: Bowel sounds are normal. There is no distension.     Palpations: Abdomen is soft.     Tenderness: There is abdominal tenderness.     Comments: Does not take deep breaths on command  Musculoskeletal:        General: Normal range of motion.     Cervical back: Neck supple.  Lymphadenopathy:     Cervical: No cervical adenopathy.  Skin:    General: Skin is warm and dry.  Neurological:     Mental Status: He is alert. Mental status is at baseline.  Psychiatric:        Mood and Affect: Mood normal.       ASSESSMENT/ PLAN:  TODAY  1. Acute dep vein thrombosis (DVT) of right femoral vein 2. adenocarcinoma of liver 3. Senile dementia without behavioral disturbance  Will stop norvasc and all vitamins and  supplements  Will stop cbg Will change to oxycodone 5 mg every 4 hours  routines Will continue to monitor his status The focus of his care will be for comfort only His goal is to go home with hospice care  Time spend with patient family 90 minutes; (50 minutes spent with advanced directives) MOST form filled out: DNR comfort care no IVF ok for abt; no tube feeding.    MD is aware of resident's narcotic use and is in agreement with current plan of care. We will attempt to wean resident as appropriate.  Ok Edwards NP [facility]  Contact [phone] Monday through Friday 8am- 5pm  After hours call [phone]

## 2020-02-23 ENCOUNTER — Encounter (HOSPITAL_COMMUNITY): Payer: Self-pay

## 2020-02-23 ENCOUNTER — Other Ambulatory Visit: Payer: Self-pay | Admitting: Adult Health

## 2020-02-23 ENCOUNTER — Non-Acute Institutional Stay (SKILLED_NURSING_FACILITY): Admitting: Adult Health

## 2020-02-23 ENCOUNTER — Encounter: Payer: Self-pay | Admitting: Adult Health

## 2020-02-23 ENCOUNTER — Other Ambulatory Visit: Payer: Self-pay

## 2020-02-23 DIAGNOSIS — F0390 Unspecified dementia without behavioral disturbance: Secondary | ICD-10-CM

## 2020-02-23 DIAGNOSIS — I82411 Acute embolism and thrombosis of right femoral vein: Secondary | ICD-10-CM | POA: Diagnosis not present

## 2020-02-23 DIAGNOSIS — N179 Acute kidney failure, unspecified: Secondary | ICD-10-CM

## 2020-02-23 DIAGNOSIS — C229 Malignant neoplasm of liver, not specified as primary or secondary: Secondary | ICD-10-CM | POA: Diagnosis not present

## 2020-02-23 DIAGNOSIS — N1832 Chronic kidney disease, stage 3b: Secondary | ICD-10-CM

## 2020-02-23 MED ORDER — MORPHINE SULFATE (CONCENTRATE) 20 MG/ML PO SOLN: 5.0000 mg | ORAL | 0 refills | Status: DC | PRN

## 2020-02-23 NOTE — Progress Notes (Signed)
I attempted to call patient's spouse for an introductory phone call. Unable to call patient due to current location. I left a voicemail introducing myself, my role and leaving my contact information.

## 2020-02-23 NOTE — Progress Notes (Signed)
This encounter was created in error - please disregard.

## 2020-02-23 NOTE — Progress Notes (Signed)
Location:    [place] Room Number: 131/P Place of Service:  SNF (31)    CODE STATUS: DNR  No Known Allergies  Chief Complaint  Patient presents with   Discharge Note    Discharge Visit    HPI:  He is being discharged to home with hospice care. His medications and dme will be provided through hospice care; and will follow up medically with hospice. He had been hospitalized and found to have liver cancer. He has declined since his admission. His family would like for him to spend his last days at home. He will benefit from roxanol as he is having increased difficultly with swallowing.    Past Medical History:  Diagnosis Date   Allergic rhinitis, cause unspecified    Dementia ([place])    Diabetes ([place])    Diverticulosis    DJD (degenerative joint disease)    Glaucoma    History of Helicobacter pylori infection    Sleep apnea    NO CPAP MACHINE    Unspecified essential hypertension     No past surgical history on file.  Social History   Socioeconomic History   Marital status: Married    Spouse name: Not on file   Number of children: 3   Years of education: Not on file   Highest education level: Not on file  Occupational History   Occupation: Retired   Tobacco Use   Smoking status: Never Smoker   Smokeless tobacco: Never Used  Scientific laboratory technician Use: Never used  Substance and Sexual Activity   Alcohol use: No   Drug use: No   Sexual activity: Not on file  Other Topics Concern   Not on file  Social History Narrative   Coffee daily    Social Determinants of Health   Financial Resource Strain:    Difficulty of Paying Living Expenses:   Food Insecurity:    Worried About Charity fundraiser in the Last Year:    Arboriculturist in the Last Year:   Transportation Needs:    Film/video editor (Medical):    Lack of Transportation (Non-Medical):   Physical Activity:    Days of Exercise per Week:     Minutes of Exercise per Session:   Stress:    Feeling of Stress :   Social Connections:    Frequency of Communication with Friends and Family:    Frequency of Social Gatherings with Friends and Family:    Attends Religious Services:    Active Member of Clubs or Organizations:    Attends Music therapist:    Marital Status:   Intimate Partner Violence:    Fear of Current or Ex-Partner:    Emotionally Abused:    Physically Abused:    Sexually Abused:    Family History  Problem Relation Age of Onset   Colon cancer Neg Hx     VITAL SIGNS BP 126/72    Pulse (!) 103    Temp 97.8 F (36.6 C) (Oral)    Resp 20    Ht '5\' 11"'$  (1.803 m)    Wt 167 lb 9.6 oz (76 kg)    SpO2 97%    BMI 23.38 kg/m   Patient's Medications  New Prescriptions   No medications on file  Previous Medications   ACETAMINOPHEN (TYLENOL) 325 MG TABLET    Take 650 mg by mouth every 6 (six) hours as needed.   ASPIRIN 81 MG TABLET  Take 81 mg by mouth daily.   DOXYCYCLINE (VIBRAMYCIN) 100 MG CAPSULE    Take 100 mg by mouth 2 (two) times daily. Special Instructions: dx BIL basilar atelectasis, right effusion Give 7 Days Hold 1 Day   ENOXAPARIN (LOVENOX) 80 MG/0.8ML INJECTION    Inject 80 mg into the skin daily.   LATANOPROST (XALATAN) 0.005 % OPHTHALMIC SOLUTION    Place 1 drop into both eyes every morning.   MORPHINE (ROXANOL) 20 MG/ML CONCENTRATED SOLUTION    1st Order:  amt: 5 mg; oral Special Instructions: pain management Every 4 Hours 12:00 AM, 04:00 AM, 08:00 AM, 12:00 PM, 04:00 PM, 08:00 PM  2nd Order:  amt: 5 mg; oral Special Instructions: pain management Every 2 Hours - PRN PRN 1, PRN 2, PRN 3, PRN 4, PRN 5, PRN 6, PRN 7, PRN 8, PRN 9, PRN 10, PRN 11, PRN 12   NON FORMULARY    Regular diet   ONDANSETRON (ZOFRAN) 4 MG TABLET    Take 1 tablet (4 mg total) by mouth every 6 (six) hours as needed for nausea.   PROBIOTIC PRODUCT (RISA-BID PROBIOTIC) TABS    Take 1 tablet by mouth 2 (two)  times daily.   SENNA-DOCUSATE (SENOKOT-S) 8.6-50 MG TABLET    Take 1 tablet by mouth 2 (two) times daily.   TERBINAFINE (LAMISIL) 1 % CREAM    Apply topically daily as needed (toe).  Modified Medications   No medications on file  Discontinued Medications   AMINO ACIDS-PROTEIN HYDROLYS (FEEDING SUPPLEMENT, PRO-STAT 64,) LIQD    Take 30 mLs by mouth 2 (two) times daily.   AMLODIPINE (NORVASC) 2.5 MG TABLET    Take 2.5 mg by mouth daily.   CHOLECALCIFEROL (VITAMIN D3) 25 MCG (1000 UT) CAPS    Take 1 capsule by mouth in the morning.   FERROUS GLUCONATE (FERGON) 324 MG TABLET    Take 324 mg by mouth daily with breakfast.   MORPHINE (ROXANOL) 20 MG/ML CONCENTRATED SOLUTION    Take 0.25 mLs (5 mg total) by mouth every 2 (two) hours as needed for severe pain. AND TAKE EVERY 4 HOURS ROUTINELY   MULTIPLE VITAMIN (MULTIVITAMIN WITH MINERALS) TABS TABLET    Take 1 tablet by mouth daily.   TRAZODONE (DESYREL) 50 MG TABLET    Take 0.5 tablets (25 mg total) by mouth at bedtime as needed for sleep.     SIGNIFICANT DIAGNOSTIC EXAMS  PREVIOUS  02-06-20: chest x-ray:  1. No active cardiopulmonary disease. 2. Probable nipple shadows at the bilateral lung bases. Repeat radiograph with nipple markers could be performed to confirm.  02-10-20: acute abdomen x-ray: Diffuse stool throughout colon. No bowel obstruction or free air. Lungs clear.  02-10-20: abdominal ultrasound:  1. Gallbladder is difficult to delineate, however may be contracted and filled with stones creating a wall echo shadow complex. No sonographic Murphy sign. If there is clinical concern for acute cholecystitis, consider further evaluation with nuclear medicine HIDA scan. 2. No biliary dilatation. 3. Heterogeneous hepatic parenchyma. Small hyperechoic lesion is likely hemangioma. The larger lesion on prior ultrasound is not well-defined on the current exam and currently blends with adjacent heterogeneous parenchyma. This could be further  evaluated with hepatic protocol MRI on an elective basis. 4. Trace abdominal ascites.   02-12-20: MRI abdomen with mrcp 1. Innumerable malignant appearing lesions in the hepatic parenchyma, most notable for a dominant mass in the right lobe of the liver which currently measures 13.9 x 8.8 x 9.9 cm. The  imaging characteristics of these lesions are nonspecific, but appear grass of (presumably malignant). Correlation with biopsy is recommended to establish a tissue diagnosis if clinically appropriate. 2. Cholelithiasis without evidence of acute cholecystitis at this time. No choledocholithiasis or signs of biliary tract obstruction. 3. Colonic diverticulosis. 4. Small right pleural effusion lying dependently. 5. Additional incidental findings, as above.  02-17-20: right lower extremity venous doppler: Extensive deep venous thrombus in right lower extremity beginning at the common femoral vein bifurcation and extending into the trifurcation in the calf.  02-19-20: renal ultrasound:  1. Limited exam.  No evidence of obstructive uropathy. 2. Heterogeneous appearance of the liver compatible with known infiltrative process seen on prior imaging. 3. Prostate gland appears enlarged and heterogeneous. Correlate with serum PSA.  02-19-20: chest x-ray: Bilateral basilar atelectasis   Right effusion.  NO NEW EXAMS.     LABS REVIEWED PREVIOUS  02-06-20: wbc 7.8; hgb 11.1; hct 33.9; mcv 101.5 plt 120; glucose 139; bun 43; creat 2.53; k+ 4.6; an++ 136; ca 9.5 ;ast 79; alk phos 448; albumin 3.7 tsh 2.209 hgb a1c 5.5 urine culture: <10,000  02-08-20: glucose 115; bun 32; creat 1.99; k+ 4.4; na++ 137; ca 9.2 mag 1.8 02-10-20: wbc 11.4; hgb 10.6; hct 32.8; mcv 101.9 plt 136; glucose 160; bun 35; creat 1.87; k+ 4.9; ca 137; ca 9.1; ast 89; alk pohs 376; total bili 1.3 albumin 2.9 02-12-20: wbc 10.1; hgb 10.6; hct 32.6; mcv 99.4 plt 162; glucose 114; bun 33; creat 1.68; k+ 3.7; na++ 135; ca 8.8 ast 56; alk phos 322;  albumin 2.6 mag 1.8 02-13-20: glucose 118; bun 38; creat 1.98; k+ 4.3; na++ 136; ca 8.2 ast 95 alk phos 381; albumin 2.5 pathology report:  Adenocarcinoma with clear cells 02-17-20: d-dimer: >20.00 02-19-20: wbc 20.4; hgb 10.4; hct 32.5; mcv 99.7 plt 194; glucose 230; bun 79; creat 3.01; k+ 5.4; na++ 134; ca 9.0; ast 112 alt 59 alk phos 597; total bili 2.3 albumin 2.5   NO NEW LABS.  Review of Systems  Unable to perform ROS: Dementia (unable to participate )    Physical Exam Constitutional:      General: He is not in acute distress.    Appearance: He is well-developed. He is not diaphoretic.  Neck:     Thyroid: No thyromegaly.  Cardiovascular:     Rate and Rhythm: Normal rate and regular rhythm.     Pulses: Normal pulses.     Heart sounds: Normal heart sounds.  Pulmonary:     Effort: Pulmonary effort is normal. No respiratory distress.     Breath sounds: Normal breath sounds.  Abdominal:     General: Bowel sounds are normal. There is no distension.     Palpations: Abdomen is soft.     Tenderness: There is abdominal tenderness.  Musculoskeletal:     Cervical back: Neck supple.     Right lower leg: No edema.     Left lower leg: No edema.  Lymphadenopathy:     Cervical: No cervical adenopathy.  Skin:    General: Skin is warm and dry.  Neurological:     Comments: Is somewhat aware        ASSESSMENT/ PLAN:   Patient is being discharged with the following home health services:  None needed   Patient is being discharged with the following durable medical equipment:  Hospice to provide   Patient has been advised to f/u with their PCP in 1-2 weeks to bring them up to date on  their rehab stay.  Social services at facility was responsible for arranging this appointment.  Pt was provided with a 30 day supply of prescriptions for medications and refills must be obtained from their PCP.  For controlled substances, a more limited supply may be provided adequate until PCP appointment  only.   His medications will be obtained through hospice care.   Time spent with patient: 20 minutes.    Ok Edwards NP [facility]  Contact [phone] Monday through Friday 8am- 5pm  After hours call [phone]

## 2020-02-24 ENCOUNTER — Telehealth (HOSPITAL_BASED_OUTPATIENT_CLINIC_OR_DEPARTMENT_OTHER): Payer: Medicare Other | Admitting: Hematology

## 2020-02-24 ENCOUNTER — Encounter (HOSPITAL_COMMUNITY): Payer: Self-pay

## 2020-02-24 DIAGNOSIS — C249 Malignant neoplasm of biliary tract, unspecified: Secondary | ICD-10-CM | POA: Diagnosis not present

## 2020-02-24 LAB — CULTURE, BLOOD (ROUTINE X 2)
Culture: NO GROWTH
Culture: NO GROWTH
Special Requests: ADEQUATE
Special Requests: ADEQUATE

## 2020-02-24 NOTE — Progress Notes (Signed)
Patient's family met with Dr. Delton Coombes today and stated that the patient has been transitioned to Hospice. No navigation needs at this time.

## 2020-02-24 NOTE — Progress Notes (Signed)
Virtual Visit via Video Note  I connected with Jerome Chambers on 02/23/2020 at  1:30 PM EDT by a video enabled telemedicine application and verified that I am speaking with the correct person using two identifiers.   I discussed the limitations of evaluation and management by telemedicine and the availability of in person appointments. The patient expressed understanding and agreed to proceed.  History of Present Illness: Jerome Chambers is a 86 year old African-American male who is seen in consultation today at the request of Dr. Brigitte Pulse for further management of adenocarcinoma, pancreaticobiliary primary or lung primary.  He has dementia.  He was recently hospitalized from 02/06/2020 through 02/13/2020 and was found to have liver mass which was biopsied.  Patient subsequently was transferred to [facility].  Patient currently at home and is enrolled in hospice.  He was also found to have right leg DVT on 02/17/2020.  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  Family history significant for brother with pancreatic cancer.  Sister had ovarian cancer.  Brother had stomach cancer.  Niece had lung cancer and was a smoker.   Observations/Objective: Patient reportedly comfortable in hospice and is receiving morphine as needed.  He reportedly had a 1 month history of right upper quadrant pain.  His pain is well controlled with morphine at this time.  His wife reports that he has become nonverbal for the last 1 week.  He was also reportedly diagnosed with dementia.  Assessment and Plan:  1.  Biliary adenocarcinoma: -MRI of the liver showed large infiltrative mass measuring 13.9 x 8.8 x 9.9 cm with multiple other satellite lesions.  Pancreas did not show any mass. -I reviewed biopsy results which showed adenocarcinoma with clear cells, positive for CK7, CDX2, TTF-1 but negative for CK20, hep par, Prostin.  This is nonspecific and differential diagnosis includes primary pancreaticobiliary and lung.  However patient is a  non-smoker.  Given MRI findings, most likely diagnosis is biliary adenocarcinoma.  However this is limited by further scans. -I had prolonged discussion with the patient's wife about his diagnosis and prognosis. -I have strongly recommended him to continue with hospice at this time.  2.  Family history: -Brother had pancreatic cancer and sister had ovarian cancer.  Another brother had stomach cancer.  Niece had lung cancer and was a smoker. -Based on these findings, I have recommended follow-up of their family members with their primary doctor to evaluate for genetic testing.   Follow Up Instructions: RTC as needed.   I discussed the assessment and treatment plan with the patient. The patient was provided an opportunity to ask questions and all were answered. The patient agreed with the plan and demonstrated an understanding of the instructions.   The patient was advised to call back or seek an in-person evaluation if the symptoms worsen or if the condition fails to improve as anticipated.  I provided 60 minutes of non-face-to-face time during this encounter.   Derek Jack, MD

## 2020-03-17 DEATH — deceased

## 2020-08-23 IMAGING — US US BIOPSY CORE LIVER
1 series · 9 of 9 positions shown · non-contrast
Comparison: none

INDICATION: 89-year-old male with a history right liver mass, referred for
biopsy

[Series 1: us biopsy (liver) · 9 of 9 slices shown]
[im 1/9]
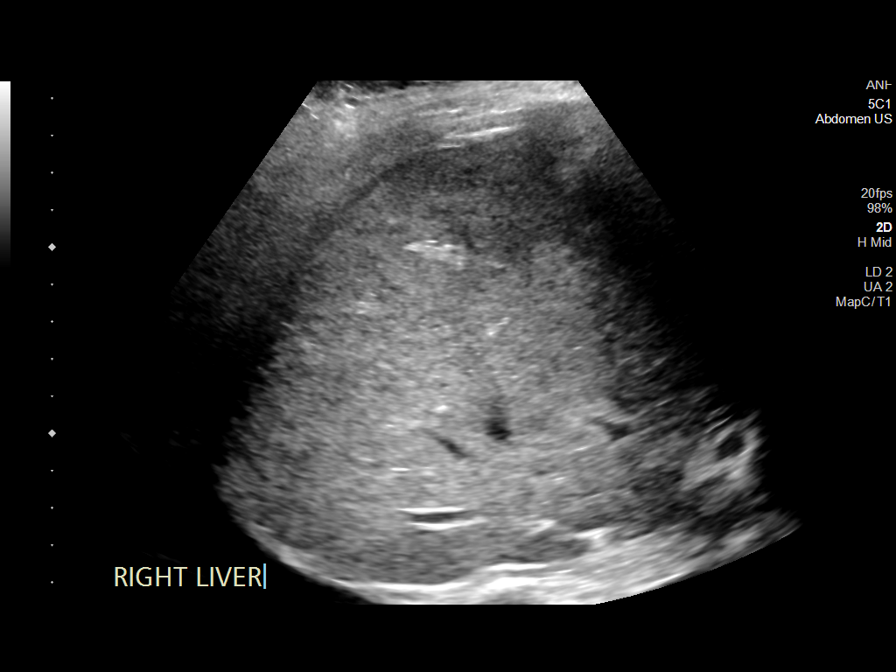
[im 2/9]
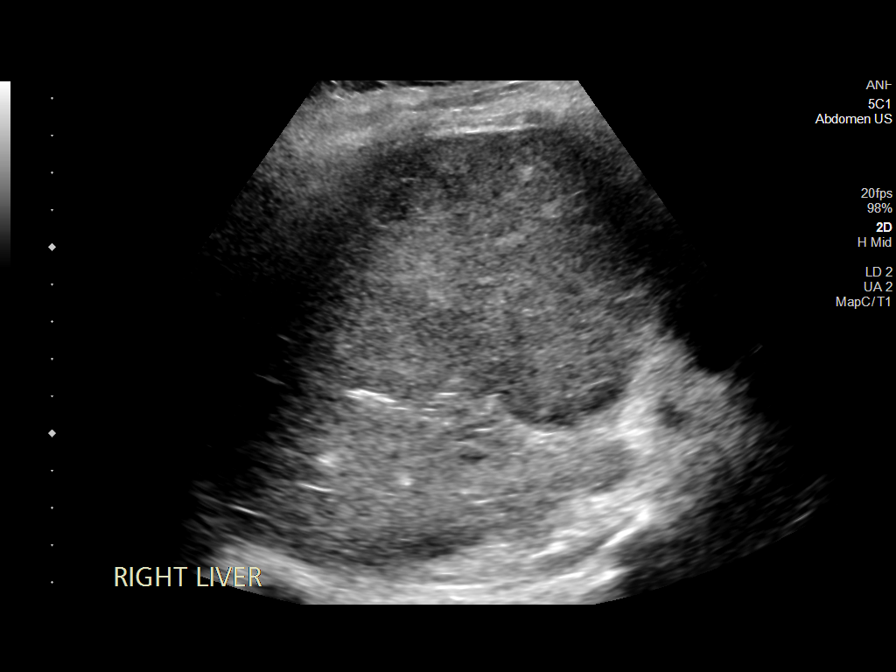
[im 3/9]
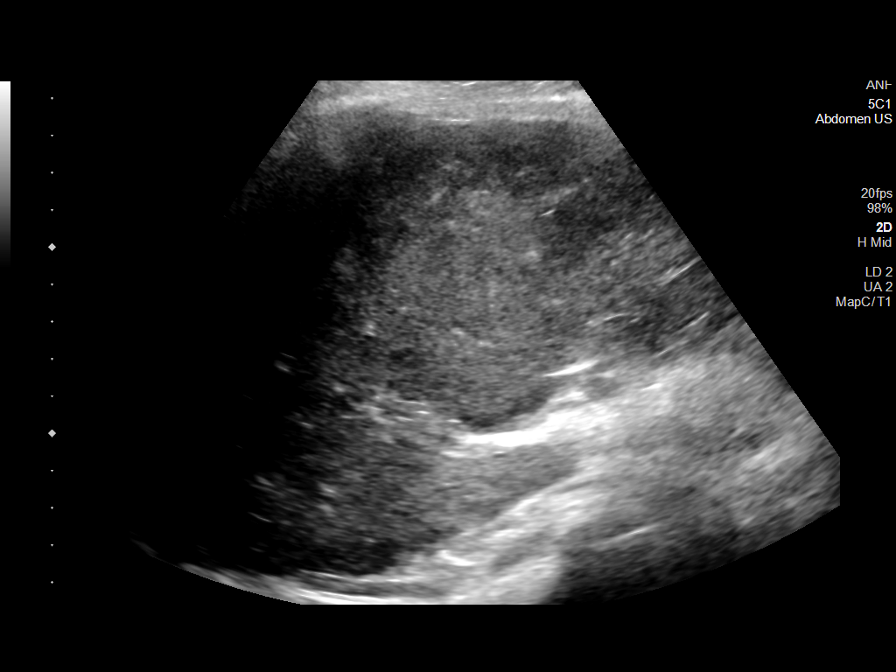
[im 4/9]
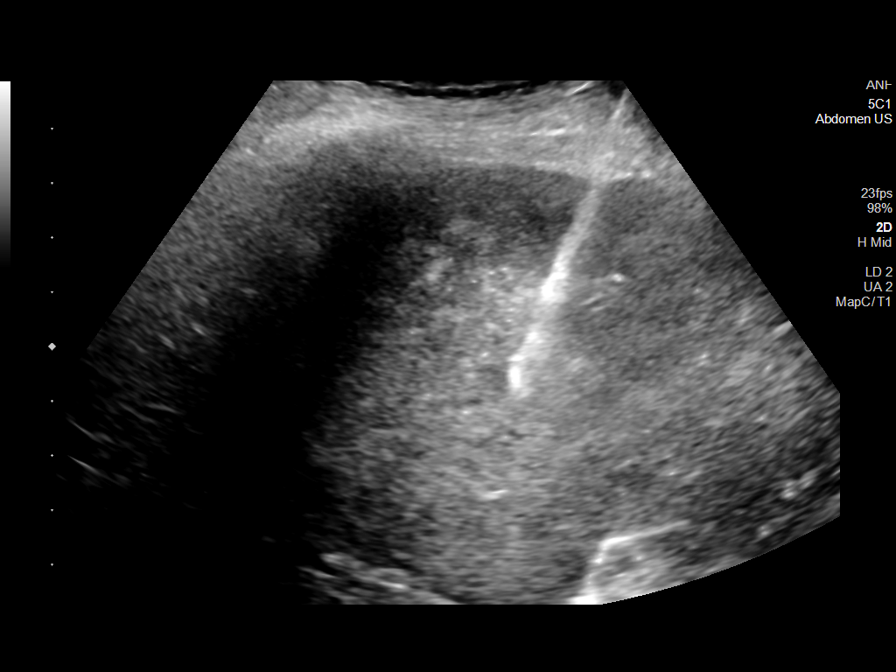
[im 5/9]
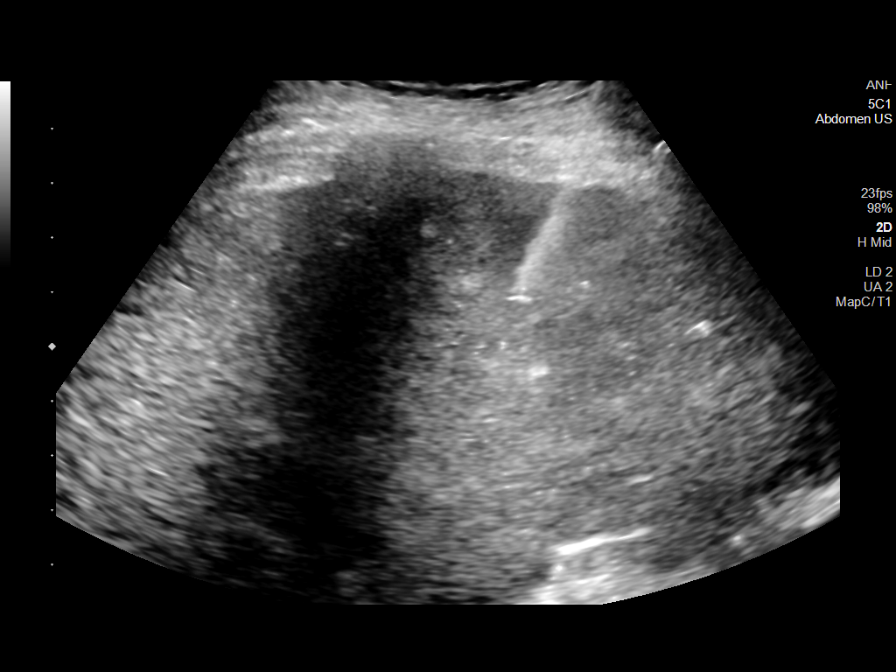
[im 6/9]
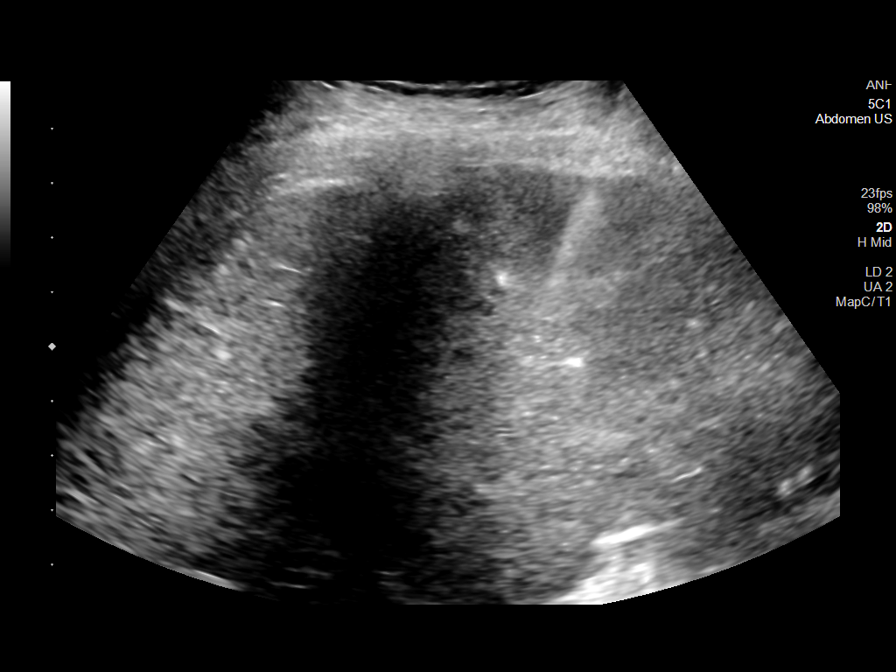
[im 7/9]
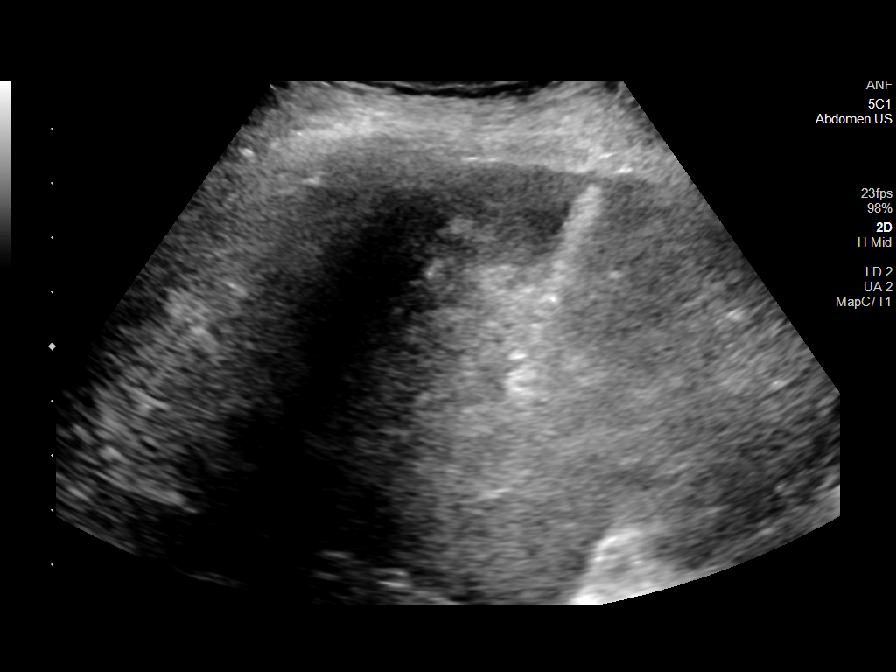
[im 8/9]
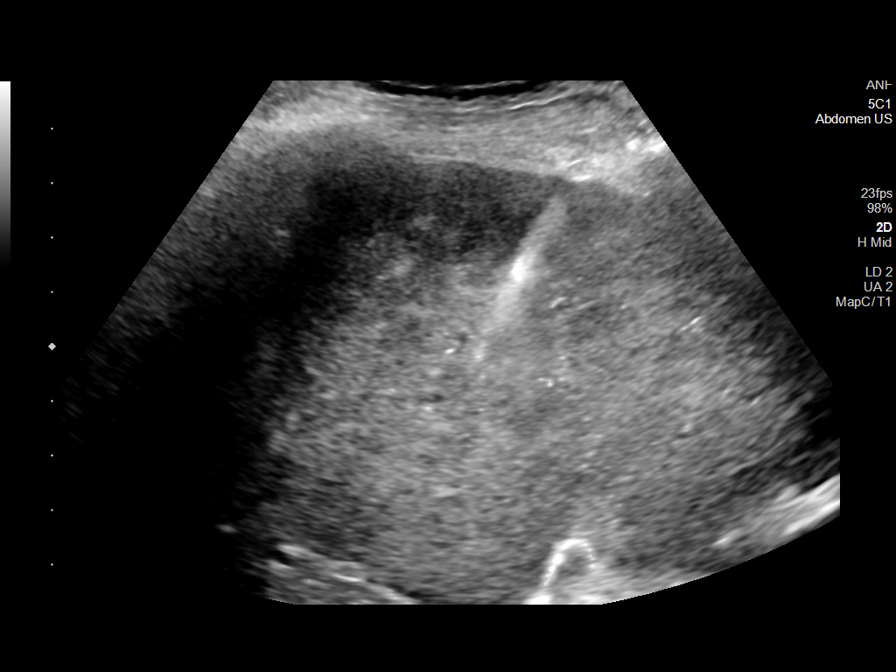
[im 9/9]
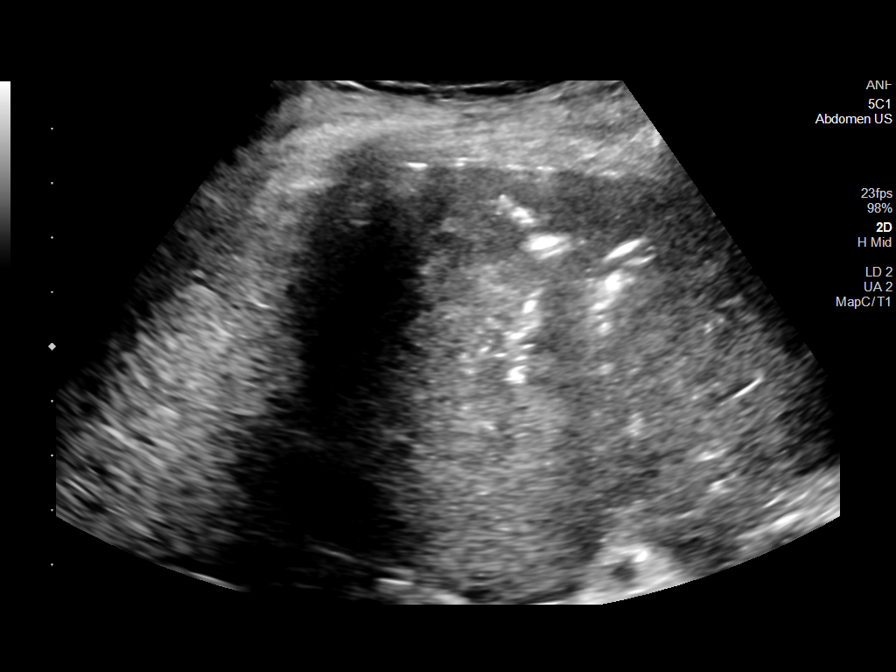

[9 of 9 positions shown; findings below may reference images not displayed]

EXAM:
ULTRASOUND-GUIDED LIVER MASS BIOPSY

MEDICATIONS:
None.

ANESTHESIA/SEDATION:
Moderate (conscious) sedation was employed during this procedure. A
total of Versed 0.5 mg and Fentanyl 25 mcg was administered
intravenously.

Moderate Sedation Time: 10 minutes. The patient's level of
consciousness and vital signs were monitored continuously by
radiology nursing throughout the procedure under my direct
supervision.

FLUOROSCOPY TIME:  None

COMPLICATIONS:
None

PROCEDURE:
Informed written consent was obtained from the patient's family
after a thorough discussion of the procedural risks, benefits and
alternatives. All questions were addressed. Maximal Sterile Barrier
Technique was utilized including caps, mask, sterile gowns, sterile
gloves, sterile drape, hand hygiene and skin antiseptic. A timeout
was performed prior to the initiation of the procedure.

Ultrasound survey of the right liver lobe performed with images
stored and sent to PACs.

The right lower thorax/right upper abdomen was prepped with
chlorhexidine in a sterile fashion, and a sterile drape was applied
covering the operative field. A sterile gown and sterile gloves were
used for the procedure. Local anesthesia was provided with 1%
Lidocaine.

The patient was prepped and draped sterilely and the skin and
subcutaneous tissues were generously infiltrated with 1% lidocaine.

A 17 gauge introducer needle was then advanced under ultrasound
guidance in an intercostal location into the right liver lobe,
targeting right liver mass. The stylet was removed, and multiple
separate 18 gauge core biopsy were retrieved. Samples were placed
into formalin for transportation to the lab.

Gel-Foam pledgets were then infused with a small amount of saline
for assistance with hemostasis.

The needle was removed, and a final ultrasound image was performed.

The patient tolerated the procedure well and remained
hemodynamically stable throughout.

No complications were encountered and no significant blood loss was
encounter.
IMPRESSION: Status post image guided biopsy of right liver mass. Tissue specimen
sent to pathology for complete histopathologic analysis.

## 2020-08-27 IMAGING — US US EXTREM LOW VENOUS*R*
1 series · 14 of 24 positions shown · non-contrast
Comparison: None.

CLINICAL DATA: Right lower extremity redness low back pain, and
edema.

EXAM:
Right LOWER EXTREMITY VENOUS DOPPLER ULTRASOUND
TECHNIQUE: Gray-scale sonography with compression, as well as color and duplex
ultrasound, were performed to evaluate the deep venous system(s)
from the level of the common femoral vein through the popliteal and
proximal calf veins.

[Series 1: us venous img lower uni right (dvt) · portal-venous · 14 of 37 slices shown]
[im 1/37]
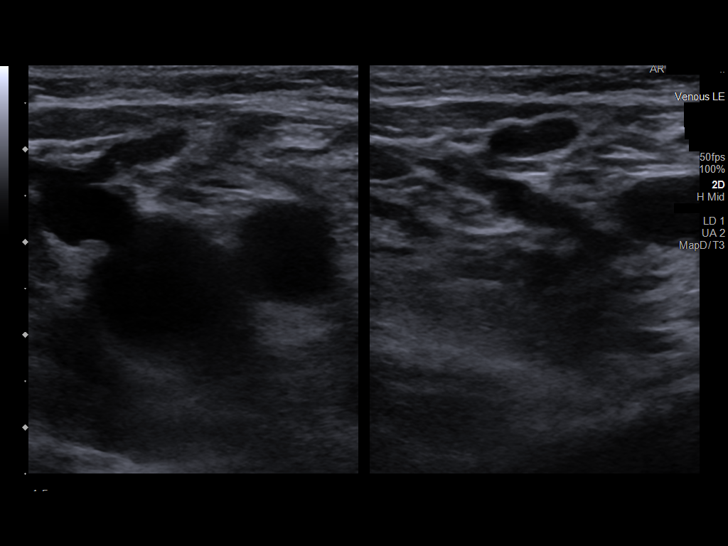
[im 4/37]
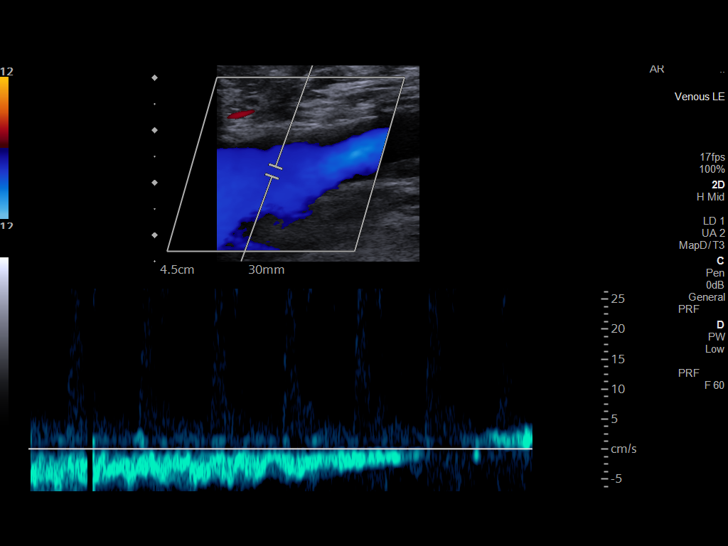
[im 7/37]
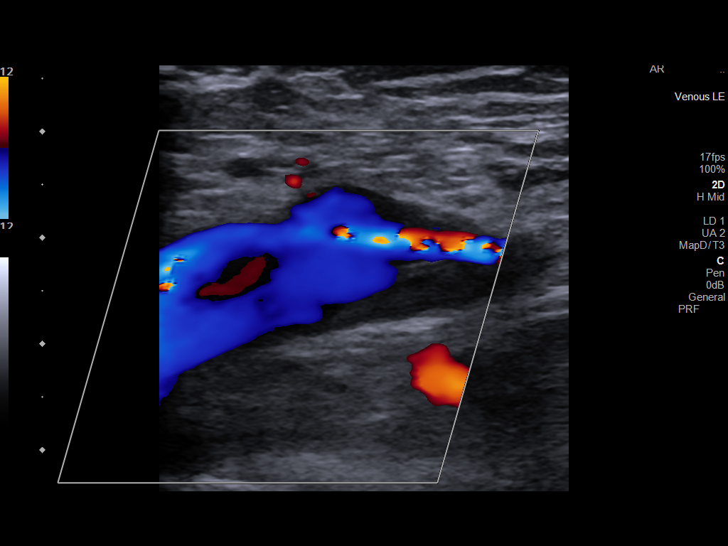
[im 10/37]
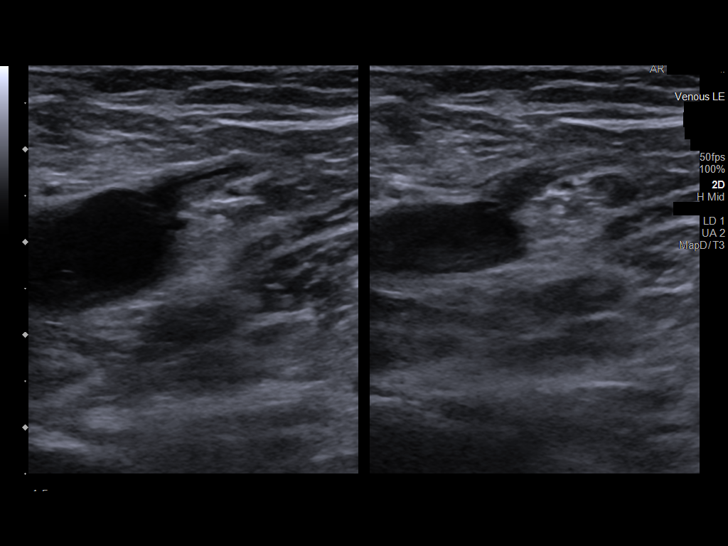
[im 11/37]
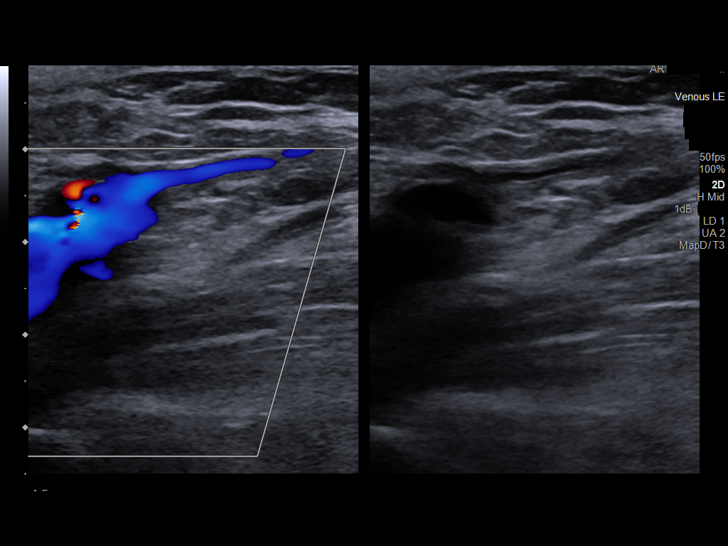
[im 15/37]
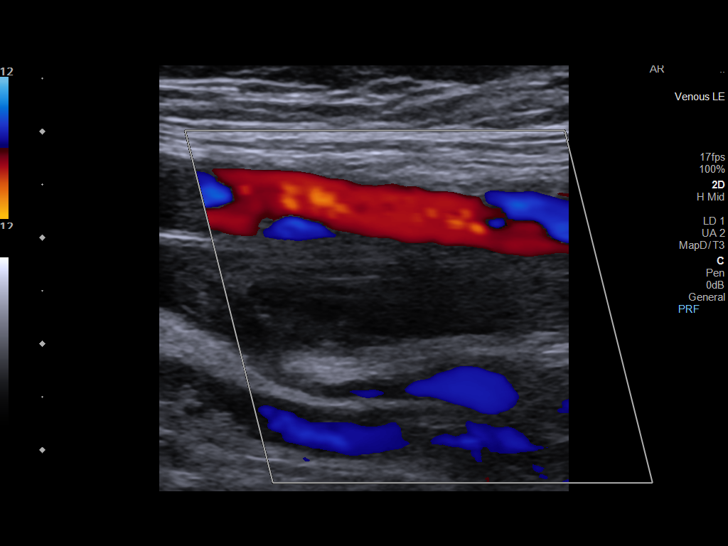
[im 18/37]
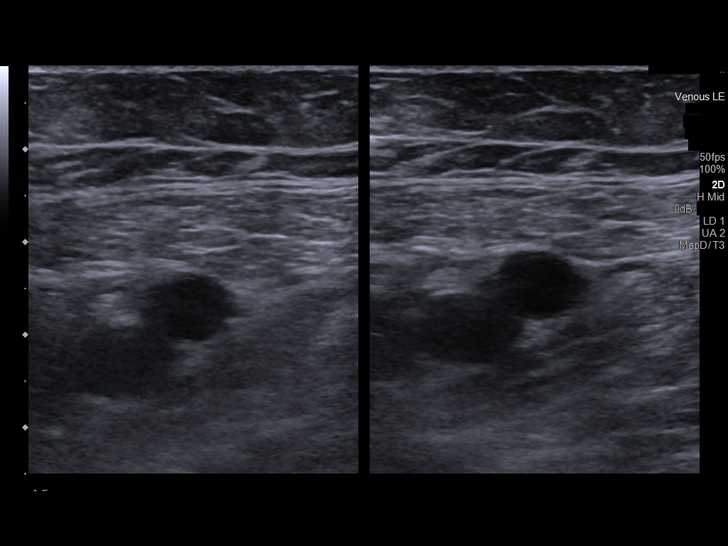
[im 19/37]
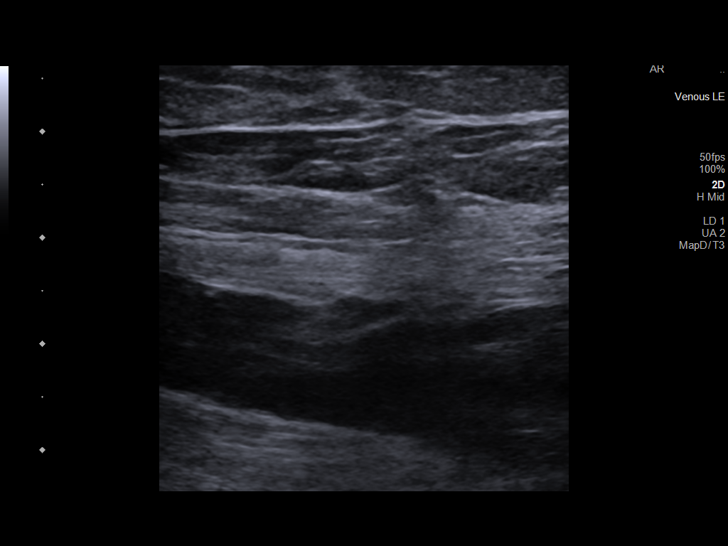
[im 22/37]
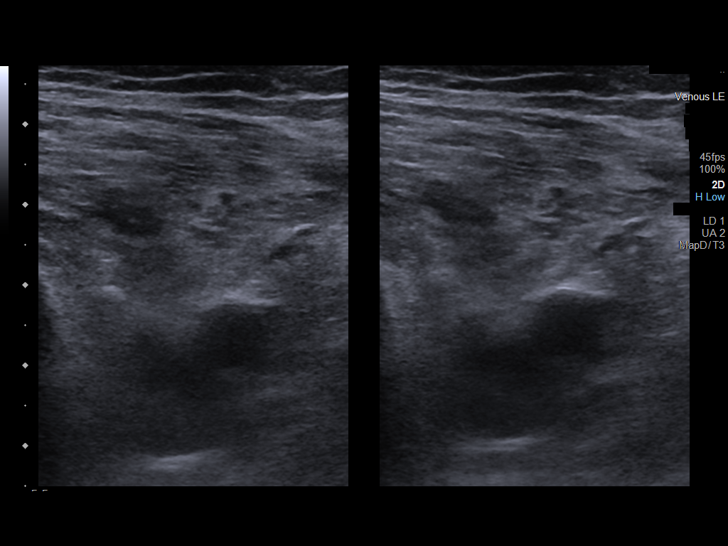
[im 26/37]
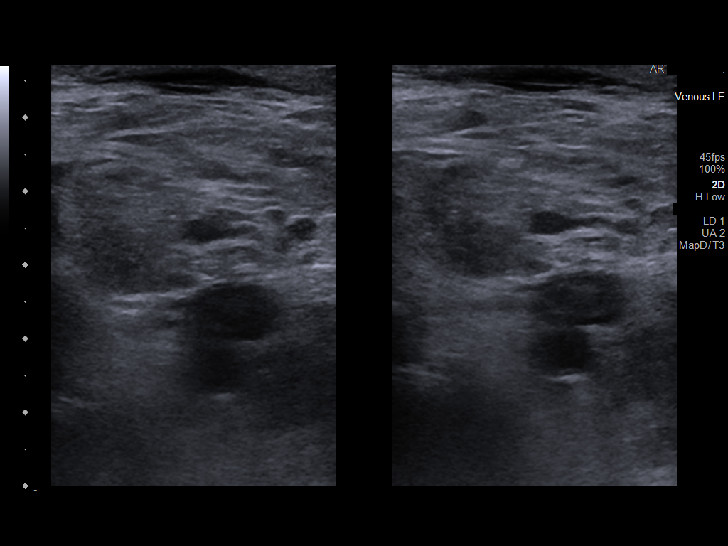
[im 29/37]
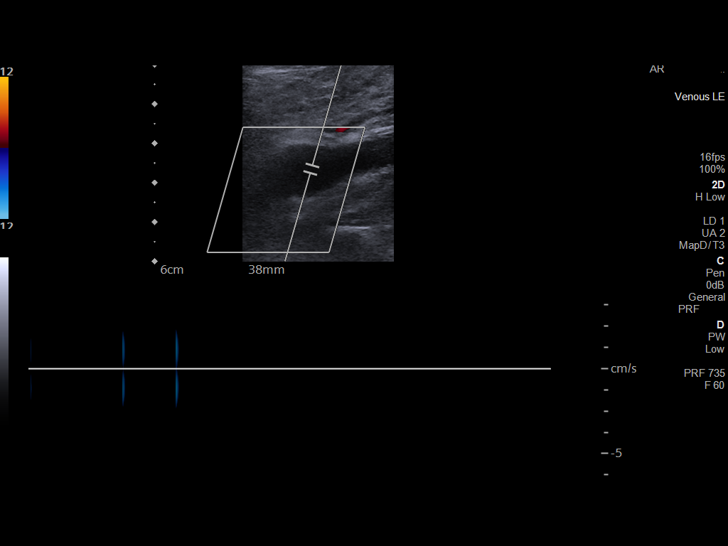
[im 30/37]
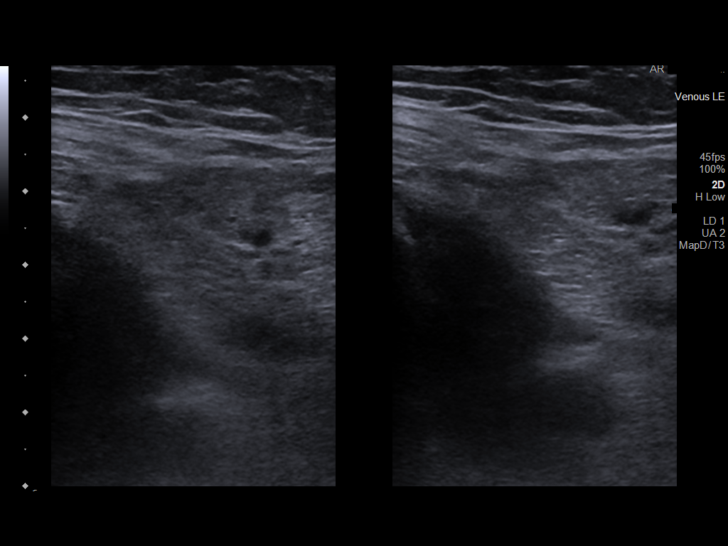
[im 33/37]
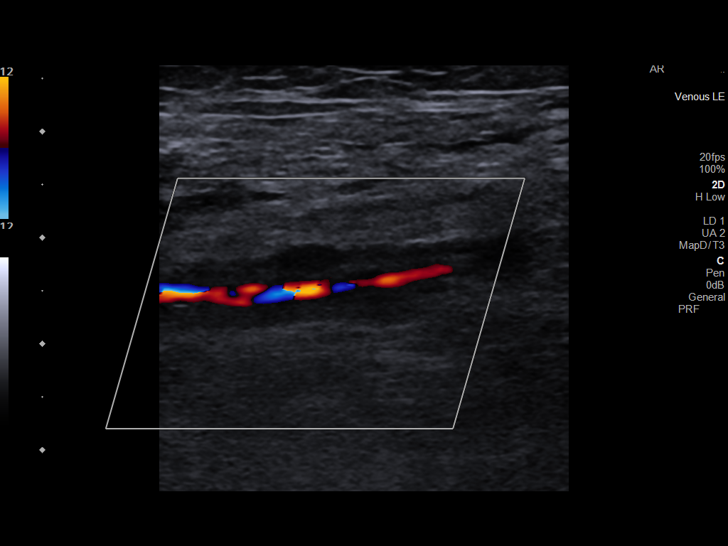
[im 37/37]
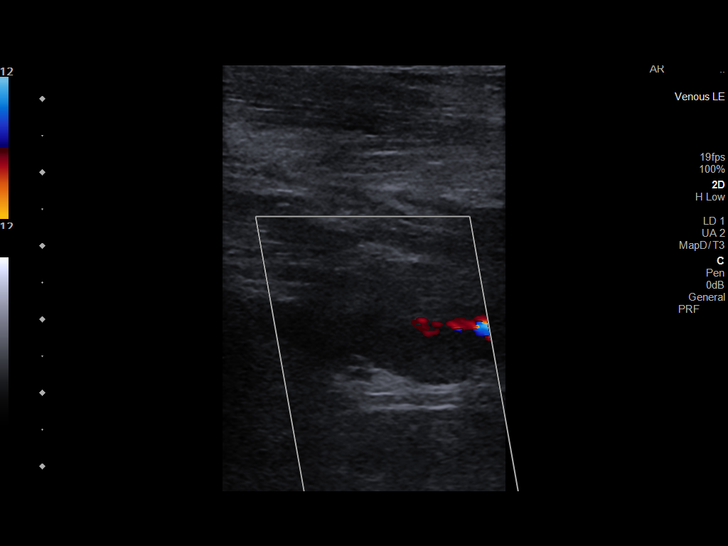

[14 of 24 positions shown; findings below may reference images not displayed]

FINDINGS: VENOUS

Thrombus is present in the common femoral vein the deep femoral
vein, popliteal vein posterior tibial vein the right. Thrombus is
occlusive from the femoral vein inferiorly.

Limited views of the contralateral common femoral vein are
unremarkable.

OTHER

None.

Limitations: none
IMPRESSION: Extensive deep venous thrombus in right lower extremity beginning at
the common femoral vein bifurcation and extending into the
trifurcation in the calf.

Critical Value/emergent results were called by telephone at the time
of interpretation on 02/17/2020 at [DATE] to provider Man [[email or web address]],
NP, who verbally acknowledged these results.
